# Patient Record
Sex: Female | Born: 1980 | Race: Black or African American | Hispanic: No | Marital: Married | Smoking: Never smoker
Health system: Southern US, Community
[De-identification: ages and names within clinical notes are randomized; demographics above are authoritative.]

## PROBLEM LIST (undated history)

## (undated) DIAGNOSIS — E559 Vitamin D deficiency, unspecified: Secondary | ICD-10-CM

## (undated) DIAGNOSIS — E049 Nontoxic goiter, unspecified: Secondary | ICD-10-CM

## (undated) DIAGNOSIS — Z8041 Family history of malignant neoplasm of ovary: Secondary | ICD-10-CM

## (undated) DIAGNOSIS — Z803 Family history of malignant neoplasm of breast: Secondary | ICD-10-CM

## (undated) DIAGNOSIS — D649 Anemia, unspecified: Secondary | ICD-10-CM

## (undated) DIAGNOSIS — Z1371 Encounter for nonprocreative screening for genetic disease carrier status: Secondary | ICD-10-CM

## (undated) HISTORY — DX: Vitamin D deficiency, unspecified: E55.9

## (undated) HISTORY — DX: Nontoxic goiter, unspecified: E04.9

## (undated) HISTORY — DX: Encounter for nonprocreative screening for genetic disease carrier status: Z13.71

## (undated) HISTORY — DX: Family history of malignant neoplasm of ovary: Z80.41

## (undated) HISTORY — DX: Anemia, unspecified: D64.9

## (undated) HISTORY — PX: NO PAST SURGERIES: SHX2092

## (undated) HISTORY — DX: Family history of malignant neoplasm of breast: Z80.3

---

## 2012-01-06 ENCOUNTER — Inpatient Hospital Stay: Payer: Self-pay | Admitting: Obstetrics and Gynecology

## 2012-01-06 LAB — CBC WITH DIFFERENTIAL/PLATELET
Basophil #: 0 10*3/uL (ref 0.0–0.1)
Basophil %: 0.3 %
Eosinophil #: 0 10*3/uL (ref 0.0–0.7)
Eosinophil %: 0.2 %
HCT: 34.9 % — ABNORMAL LOW (ref 35.0–47.0)
HGB: 12.1 g/dL (ref 12.0–16.0)
Lymphocyte #: 1.6 10*3/uL (ref 1.0–3.6)
Lymphocyte %: 11.6 %
MCH: 27.5 pg (ref 26.0–34.0)
MCHC: 34.6 g/dL (ref 32.0–36.0)
MCV: 80 fL (ref 80–100)
Monocyte #: 0.7 x10 3/mm (ref 0.2–0.9)
Monocyte %: 5.3 %
Neutrophil #: 11.6 10*3/uL — ABNORMAL HIGH (ref 1.4–6.5)
Neutrophil %: 82.6 %
Platelet: 258 10*3/uL (ref 150–440)
RBC: 4.39 10*6/uL (ref 3.80–5.20)
RDW: 15.3 % — ABNORMAL HIGH (ref 11.5–14.5)
WBC: 14 10*3/uL — ABNORMAL HIGH (ref 3.6–11.0)

## 2012-01-07 LAB — HEMATOCRIT: HCT: 28.5 % — ABNORMAL LOW (ref 35.0–47.0)

## 2012-09-09 ENCOUNTER — Ambulatory Visit: Payer: Self-pay

## 2013-12-01 LAB — TSH: TSH: 0.53 u[IU]/mL (ref 0.41–5.90)

## 2013-12-05 ENCOUNTER — Ambulatory Visit: Payer: Self-pay | Admitting: Internal Medicine

## 2014-01-04 ENCOUNTER — Ambulatory Visit: Payer: Self-pay | Admitting: Otolaryngology

## 2015-02-03 ENCOUNTER — Encounter: Payer: Self-pay | Admitting: *Deleted

## 2015-02-03 ENCOUNTER — Ambulatory Visit
Admission: EM | Admit: 2015-02-03 | Discharge: 2015-02-03 | Disposition: A | Discharge status: Home / Self Care | Payer: No Typology Code available for payment source | Attending: Family Medicine | Admitting: Family Medicine

## 2015-02-03 DIAGNOSIS — J0100 Acute maxillary sinusitis, unspecified: Secondary | ICD-10-CM

## 2015-02-03 DIAGNOSIS — J0101 Acute recurrent maxillary sinusitis: Secondary | ICD-10-CM | POA: Diagnosis not present

## 2015-02-03 MED ORDER — MOMETASONE FUROATE 50 MCG/ACT NA SUSP: 2.0000 | Freq: Every day | NASAL | Status: DC

## 2015-02-03 MED ORDER — AMOXICILLIN-POT CLAVULANATE 875-125 MG PO TABS: 1.0000 | ORAL_TABLET | Freq: Two times a day (BID) | ORAL | Status: DC

## 2015-02-03 MED ORDER — FEXOFENADINE-PSEUDOEPHED ER 180-240 MG PO TB24: 1.0000 | ORAL_TABLET | Freq: Every day | ORAL | Status: DC

## 2015-02-03 NOTE — ED Provider Notes (Signed)
CSN: [id number]     Arrival date & time 02/03/15  0805 History   First MD Initiated Contact with Patient 02/03/15 [id number]    Nurses notes were reviewed.  Chief Complaint  Patient presents with  . Facial Pain    Patient reports having facial pain nasal congestion and coughing. She states is been going on for over a week but progressively getting worse. She denies any concerns symptoms mostly pressure behind her eyes and nose and the right is bothering her as well.  (Consider location/radiation/quality/duration/timing/severity/associated sxs/prior Treatment) Patient is a 34 y.o. female presenting with URI. The history is provided by the patient. No language interpreter was used.  URI Presenting symptoms: congestion, cough, ear pain, rhinorrhea and sore throat   Ear pain:    Location:  Right   Severity:  Moderate   Duration:  4 days   Progression:  Worsening   Chronicity:  New Severity:  Moderate Onset quality:  Sudden Timing:  Constant Chronicity:  New Relieved by:  Nothing Ineffective treatments: Afrin nasal spray. Associated symptoms: headaches, sinus pain and swollen glands   Associated symptoms: no myalgias, no sneezing and no wheezing   Risk factors: not elderly, no chronic cardiac disease, no chronic kidney disease, no chronic respiratory disease, no diabetes mellitus, no immunosuppression, no recent illness, no recent travel and no sick contacts     History reviewed. No pertinent past medical history. No past surgical history on file. History reviewed. No pertinent family history. Social History  Substance Use Topics  . Smoking status: None  . Smokeless tobacco: None  . Alcohol Use: None   OB History    No data available     Review of Systems  HENT: Positive for congestion, ear pain, rhinorrhea and sore throat. Negative for sneezing.   Respiratory: Positive for cough. Negative for wheezing.   Musculoskeletal: Negative for myalgias.  Neurological: Positive for  headaches.  All other systems reviewed and are negative.   Allergies  Review of patient's allergies indicates no known allergies.  Home Medications   Prior to Admission medications   Medication Sig Start Date End Date Taking? Authorizing Provider  aspirin 325 MG tablet Take 325 mg by mouth daily.   Yes Historical Provider, MD  phenylephrine (SUDAFED PE) 10 MG TABS tablet Take 10 mg by mouth every 4 (four) hours as needed.   Yes Historical Provider, MD  amoxicillin-clavulanate (AUGMENTIN) 875-125 MG tablet Take 1 tablet by mouth 2 (two) times daily. 02/03/15   Hassan Rowan, MD  fexofenadine-pseudoephedrine (ALLEGRA-D ALLERGY & CONGESTION) 180-240 MG 24 hr tablet Take 1 tablet by mouth daily. 02/03/15   Hassan Rowan, MD  mometasone (NASONEX) 50 MCG/ACT nasal spray Place 2 sprays into the nose daily. 02/03/15   Hassan Rowan, MD   Meds Ordered and Administered this Visit  Medications - No data to display  BP 121/81 mmHg  Pulse 96  Temp(Src) 98.1 F (36.7 C) (Oral)  Ht  (1.676 m)  Wt 235 lb (106.595 kg)  BMI 37.95 kg/m2  SpO2 98%  LMP 01/21/2015 No data found.   Physical Exam  Constitutional: She appears well-developed.  HENT:  Head: Normocephalic and atraumatic.  Right Ear: Hearing and ear canal normal. Tympanic membrane is erythematous and bulging.  Left Ear: Hearing, tympanic membrane, external ear and ear canal normal.  Nose: Mucosal edema and rhinorrhea present. Right sinus exhibits maxillary sinus tenderness.  Mouth/Throat: Mucous membranes are normal. No oral lesions. No dental caries. Posterior oropharyngeal erythema present.  Vitals  reviewed.   ED Course  Procedures (including critical care time)  Labs Review Labs Reviewed - No data to display  Imaging Review No results found.   Visual Acuity Review  Right Eye Distance:   Left Eye Distance:   Bilateral Distance:    Right Eye Near:   Left Eye Near:    Bilateral Near:         MDM   1. Acute  recurrent maxillary sinusitis    We'll place on Augmentin 875 one tablet twice a day Flonase nasal spray recommend she stop using the Afrin nasal spray that she's currently using now. Follow-up PCP in a week if not better work note given for today as well. Also placed on Allegra-D 1 tablet daily.    Hassan RowanEugene Lavelle Akel, MD 02/03/15 1044

## 2015-02-03 NOTE — ED Notes (Signed)
Sinus congestion and headache for over a week.  Increase in sinus congestion over the last three days with no relief from otc Sudafed and [employer] Pot.  Mucus is thick beige.

## 2015-02-03 NOTE — Discharge Instructions (Signed)
Sinusitis, Adult °Sinusitis is redness, soreness, and puffiness (inflammation) of the air pockets in the bones of your face (sinuses). The redness, soreness, and puffiness can cause air and mucus to get trapped in your sinuses. This can allow germs to grow and cause an infection.  °HOME CARE  °· Drink enough fluids to keep your pee (urine) clear or pale yellow. °· Use a humidifier in your home. °· Run a hot shower to create steam in the bathroom. Sit in the bathroom with the door closed. Breathe in the steam 3-4 times a day. °· Put a warm, moist washcloth on your face 3-4 times a day, or as told by your doctor. °· Use salt water sprays (saline sprays) to wet the thick fluid in your nose. This can help the sinuses drain. °· Only take medicine as told by your doctor. °GET HELP RIGHT AWAY IF:  °· Your pain gets worse. °· You have very bad headaches. °· You are sick to your stomach (nauseous). °· You throw up (vomit). °· You are very sleepy (drowsy) all the time. °· Your face is puffy (swollen). °· Your vision changes. °· You have a stiff neck. °· You have trouble breathing. °MAKE SURE YOU:  °· Understand these instructions. °· Will watch your condition. °· Will get help right away if you are not doing well or get worse. °  °This information is not intended to replace advice given to you by your health care provider. Make sure you discuss any questions you have with your health care provider. °  °Document Released: 08/26/2007 Document Revised: 03/30/2014 Document Reviewed: 10/13/2011 °Elsevier Interactive Patient Education ©2016 Elsevier Inc[employer] °Barotitis Media °Barotitis media is inflammation of your middle ear. This occurs when the auditory tube (eustachian tube) leading from the back of your nose (nasopharynx) to your eardrum is blocked. This blockage may result from a cold, environmental allergies, or an upper respiratory infection. Unresolved barotitis media may lead to damage or hearing loss (barotrauma), which may  become permanent. °HOME CARE INSTRUCTIONS  °· Use medicines as recommended by your health care provider. Over-the-counter medicines will help unblock the canal and can help during times of air travel. °· Do not put anything into your ears to clean or unplug them. Eardrops will not be helpful. °· Do not swim, dive, or fly until your health care provider says it is all right to do so. If these activities are necessary, chewing gum with frequent, forceful swallowing may help. It is also helpful to hold your nose and gently blow to pop your ears for equalizing pressure changes. This forces air into the eustachian tube. °· Only take over-the-counter or prescription medicines for pain, discomfort, or fever as directed by your health care provider. °· A decongestant may be helpful in decongesting the middle ear and make pressure equalization easier. °SEEK MEDICAL CARE IF: °· You experience a serious form of dizziness in which you feel as if the room is spinning and you feel nauseated (vertigo). °· Your symptoms only involve one ear. °SEEK IMMEDIATE MEDICAL CARE IF:  °· You develop a severe headache, dizziness, or severe ear pain. °· You have bloody or pus-like drainage from your ears. °· You develop a fever. °· Your problems do not improve or become worse. °MAKE SURE YOU:  °· Understand these instructions. °· Will watch your condition. °· Will get help right away if you are not doing well or get worse. °  °This information is not intended to replace advice given to   you by your health care provider. Make sure you discuss any questions you have with your health care provider. °  °Document Released: 03/06/2000 Document Revised: 12/28/2012 Document Reviewed: 10/04/2012 °Elsevier Interactive Patient Education ©2016 Elsevier Inc. ° °

## 2015-02-27 ENCOUNTER — Encounter: Payer: Self-pay | Admitting: Internal Medicine

## 2015-02-27 DIAGNOSIS — E010 Iodine-deficiency related diffuse (endemic) goiter: Secondary | ICD-10-CM

## 2015-02-27 DIAGNOSIS — J45909 Unspecified asthma, uncomplicated: Secondary | ICD-10-CM | POA: Insufficient documentation

## 2015-02-27 DIAGNOSIS — E049 Nontoxic goiter, unspecified: Secondary | ICD-10-CM | POA: Insufficient documentation

## 2015-11-21 ENCOUNTER — Other Ambulatory Visit: Payer: Self-pay | Admitting: Internal Medicine

## 2015-11-27 ENCOUNTER — Ambulatory Visit: Payer: Self-pay | Admitting: Internal Medicine

## 2016-01-17 ENCOUNTER — Other Ambulatory Visit: Payer: Self-pay | Admitting: Otolaryngology

## 2016-01-17 DIAGNOSIS — E041 Nontoxic single thyroid nodule: Secondary | ICD-10-CM

## 2016-01-29 ENCOUNTER — Ambulatory Visit
Admission: RE | Admit: 2016-01-29 | Discharge: 2016-01-29 | Disposition: A | Discharge status: Home / Self Care | Payer: No Typology Code available for payment source | Source: Ambulatory Visit | Attending: Otolaryngology | Admitting: Otolaryngology

## 2016-01-29 DIAGNOSIS — E041 Nontoxic single thyroid nodule: Secondary | ICD-10-CM | POA: Insufficient documentation

## 2016-08-11 ENCOUNTER — Encounter: Payer: Self-pay | Admitting: Obstetrics and Gynecology

## 2016-08-11 ENCOUNTER — Ambulatory Visit (INDEPENDENT_AMBULATORY_CARE_PROVIDER_SITE_OTHER): Payer: No Typology Code available for payment source | Admitting: Obstetrics and Gynecology

## 2016-08-11 VITALS — BP 124/72 | Ht 66.0 in | Wt 223.0 lb

## 2016-08-11 DIAGNOSIS — N760 Acute vaginitis: Secondary | ICD-10-CM | POA: Diagnosis not present

## 2016-08-11 DIAGNOSIS — Z30430 Encounter for insertion of intrauterine contraceptive device: Secondary | ICD-10-CM | POA: Diagnosis not present

## 2016-08-11 DIAGNOSIS — Z8041 Family history of malignant neoplasm of ovary: Secondary | ICD-10-CM | POA: Diagnosis not present

## 2016-08-11 DIAGNOSIS — Z01419 Encounter for gynecological examination (general) (routine) without abnormal findings: Secondary | ICD-10-CM

## 2016-08-11 MED ORDER — CLOTRIMAZOLE-BETAMETHASONE 1-0.05 % EX CREA: 1.0000 "application " | TOPICAL_CREAM | Freq: Two times a day (BID) | CUTANEOUS | 0 refills | Status: DC

## 2016-08-11 MED ORDER — LEVONORGESTREL 20 MCG/24HR IU IUD: 1.0000 | INTRAUTERINE_SYSTEM | Freq: Once | INTRAUTERINE | 0 refills | Status: AC

## 2016-08-11 NOTE — Progress Notes (Addendum)
Chief Complaint  Patient presents with  . Annual Exam     HPI:      [name] is a 36 y.o. [id number] who LMP was Patient's last menstrual period was 08/07/2016., presents today for her annual examination.  Her menses are regular every 28-30 days, lasting 5 days.  Dysmenorrhea none. She does not have intermenstrual bleeding.  Sex activity: single partner, contraception - condoms sometimes. She had decreased libido and spotting with OCPs/nuvaring. Did depo in the past. Hesitant about IUD but wants to try it.  Last Pap: May 16, 2015  Results were: no abnormalities /neg HPV DNA  Hx of STDs: none  There is a FH of breast cancer in her MGM and PGM. There is a FH of ovarian cancer in her pat aunt. The patient does not do self-breast exams.  Tobacco use: The patient denies current or previous tobacco use. Alcohol use: social drinker Exercise: not active  She does not get adequate calcium and Vitamin D in her diet.  She is being followed for enlarged thyroid.  Past Medical History:  Diagnosis Date  . Anemia     History reviewed. No pertinent surgical history.  Family History  Problem Relation Age of Onset  . Breast cancer Maternal Grandmother        ? age  . Diabetes Maternal Grandmother   . Breast cancer Paternal Grandmother 59  . Diabetes Paternal Grandmother   . Colon cancer Paternal Grandfather   . Ovarian cancer Paternal Aunt 36    Social History   Social History  . Marital status: Married    Spouse name: N/A  . Number of children: N/A  . Years of education: N/A   Occupational History  . Not on file.   Social History Main Topics  . Smoking status: Never Smoker  . Smokeless tobacco: Never Used  . Alcohol use Yes  . Drug use: No  . Sexual activity: Yes    Birth control/ protection: None   Other Topics Concern  . Not on file   Social History Narrative  . No narrative on file     Current Outpatient Prescriptions:  .  mometasone  (NASONEX) 50 MCG/ACT nasal spray, Place 2 sprays into the nose daily., Disp: 17 g, Rfl: -0 .  phenylephrine (SUDAFED PE) 10 MG TABS tablet, Take 10 mg by mouth every 4 (four) hours as needed., Disp: , Rfl:  .  amoxicillin-clavulanate (AUGMENTIN) 875-125 MG tablet, Take 1 tablet by mouth 2 (two) times daily. (Patient not taking: Reported on 08/11/2016), Disp: 20 tablet, Rfl: 0 .  aspirin 325 MG tablet, Take 325 mg by mouth daily., Disp: , Rfl:  .  clotrimazole-betamethasone (LOTRISONE) cream, Apply 1 application topically 2 (two) times daily. Apply externally BID prn sx up to 2 wks, Disp: 15 g, Rfl: 0 .  fexofenadine-pseudoephedrine (ALLEGRA-D ALLERGY & CONGESTION) 180-240 MG 24 hr tablet, Take 1 tablet by mouth daily. (Patient not taking: Reported on 08/11/2016), Disp: 30 tablet, Rfl: 0 .  levonorgestrel (MIRENA, 52 MG,) 20 MCG/24HR IUD, 1 Intra Uterine Device (1 each total) by Intrauterine route once., Disp: 1 Intra Uterine Device, Rfl: 0  ROS:  Review of Systems  Constitutional: Negative for fatigue, fever and unexpected weight change.  Respiratory: Negative for cough, shortness of breath and wheezing.   Cardiovascular: Negative for chest pain, palpitations and leg swelling.  Gastrointestinal: Negative for blood in stool, constipation, diarrhea, nausea and vomiting.  Endocrine: Negative for cold intolerance, heat intolerance and  polyuria.  Genitourinary: Negative for dyspareunia, dysuria, flank pain, frequency, genital sores, hematuria, menstrual problem, pelvic pain, urgency, vaginal bleeding, vaginal discharge and vaginal pain.  Musculoskeletal: Negative for back pain, joint swelling and myalgias.  Skin: Negative for rash.  Neurological: Negative for dizziness, syncope, light-headedness, numbness and headaches.  Hematological: Negative for adenopathy.  Psychiatric/Behavioral: Negative for agitation, confusion, sleep disturbance and suicidal ideas. The patient is not nervous/anxious.       Objective: BP 124/72   Ht 5\' 6"  (1.676 m)   Wt 223 lb (101.2 kg)   LMP 08/07/2016   BMI 35.99 kg/m    Physical Exam  Constitutional: She is oriented to person, place, and time. She appears well-developed and well-nourished.  Genitourinary: Vagina normal and uterus normal. There is no rash or tenderness on the right labia. There is no rash or tenderness on the left labia. No erythema or tenderness in the vagina. No vaginal discharge found. Right adnexum does not display mass and does not display tenderness. Left adnexum does not display mass and does not display tenderness. Cervix does not exhibit motion tenderness or polyp. Uterus is not enlarged or tender.  Neck: Normal range of motion. No thyromegaly present.  Cardiovascular: Normal rate, regular rhythm and normal heart sounds.   No murmur heard. Pulmonary/Chest: Effort normal and breath sounds normal. Right breast exhibits no mass, no nipple discharge, no skin change and no tenderness. Left breast exhibits no mass, no nipple discharge, no skin change and no tenderness.  Abdominal: Soft. There is no tenderness. There is no guarding.  Musculoskeletal: Normal range of motion.  Neurological: She is alert and oriented to person, place, and time. No cranial nerve deficit.  Psychiatric: She has a normal mood and affect. Her behavior is normal.  Vitals reviewed.   Assessment/Plan: Encounter for annual routine gynecological examination  Encounter for insertion of intrauterine contraceptive device (IUD) - Plan: levonorgestrel (MIRENA, 52 MG,) 20 MCG/24HR IUD  Acute vaginitis - Pt with ext irritation. Neg exam. Try Rx clotrimazole/betamethasone crm. F/u prn. - Plan: clotrimazole-betamethasone (LOTRISONE) cream  Family history of ovarian cancer - My Risk testing discussed and handout given. Pt to consider and f/u prn.            GYN counsel family planning choices, adequate intake of calcium and vitamin D, diet and exercise      F/U  Return in about 4 weeks (around 09/08/2016) for IUD check.  Alicia B. Copland, PA-C 08/11/2016 2:38 PM      IUD PROCEDURE NOTE:  Christine Meadows is a 36 y.o. [id number] here for Mirena  IUD insertion. No GYN concerns.   BP 124/72   Ht 5\' 6"  (1.676 m)   Wt 223 lb (101.2 kg)   LMP 08/07/2016   BMI 35.99 kg/m   IUD Insertion Procedure Note Patient identified, informed consent performed, consent signed.   Discussed risks of irregular bleeding, cramping, infection, malpositioning or misplacement of the IUD outside the uterus which may require further procedure such as laparoscopy, risk of failure <1%. Time out was performed.    A bimanual exam showed the uterus to be anteverted.  Speculum placed in the vagina.  Cervix visualized.  Cleaned with Betadine x 2.  Grasped anteriorly with a single tooth tenaculum.  Uterus sounded to 8.0 cm.   IUD placed per manufacturer's recommendations.  Strings trimmed to 3 cm. Tenaculum was removed, good hemostasis noted.  Patient tolerated procedure well.   ASSESSMENT: IUD insertion Lot [id number] Exp 01/2019  Plan:  Patient was given post-procedure instructions.  She was advised to have backup contraception for one week.   Call if you are having increasing pain, cramps or bleeding or if you have a fever greater than 100.4 degrees F., shaking chills, nausea or vomiting. Patient was also asked to check IUD strings periodically and follow up in 4 weeks for IUD check.  Return in about 4 weeks (around 09/08/2016) for IUD check.  Alicia B. Copland, PA-C 08/11/2016 2:38 PM

## 2016-09-08 ENCOUNTER — Ambulatory Visit (INDEPENDENT_AMBULATORY_CARE_PROVIDER_SITE_OTHER): Payer: No Typology Code available for payment source | Admitting: Obstetrics and Gynecology

## 2016-09-08 ENCOUNTER — Encounter: Payer: Self-pay | Admitting: Obstetrics and Gynecology

## 2016-09-08 VITALS — BP 112/62 | HR 74 | Ht 65.0 in | Wt 218.0 lb

## 2016-09-08 DIAGNOSIS — Z30431 Encounter for routine checking of intrauterine contraceptive device: Secondary | ICD-10-CM | POA: Diagnosis not present

## 2016-09-08 NOTE — Progress Notes (Signed)
   Chief Complaint  Patient presents with  . Contraception    IUD string check     History of Present Illness:  Lyanne CoJamila Satterfield Sepulveda is a 36 y.o. that had a Mirena IUD placed approximately 1 month ago. Since that time, she states she has had daily dark brown spotting, minimal cramping. No pelvic pain, heavy bleeding, unusual d/c.   Review of Systems  Constitutional: Negative for fever.  Gastrointestinal: Negative for blood in stool, constipation, diarrhea, nausea and vomiting.  Genitourinary: Positive for vaginal bleeding. Negative for dyspareunia, dysuria, flank pain, frequency, hematuria, urgency, vaginal discharge and vaginal pain.  Musculoskeletal: Negative for back pain.  Skin: Negative for rash.    Physical Exam:  BP 112/62   Pulse 74   Ht 5\' 5"  (1.651 m)   Wt 218 lb (98.9 kg)   LMP 08/07/2016 (Exact Date)   BMI 36.28 kg/m  Body mass index is 36.28 kg/m.  Pelvic exam:  Two IUD strings present seen coming from the cervical os. EGBUS, vaginal vault and cervix: within normal limits   Assessment:  Routine checking of IUD Encounter for routine checking of intrauterine contraceptive device (IUD) - IUD in place. Reassurance re: spotting/minimal cramping. F/u prn.  IUD strings present in proper location; pt doing well  Plan: F/u if any signs of infection or can no longer feel the strings.   Alicia B. Copland, PA-C 09/08/2016 4:11 PM

## 2016-10-14 ENCOUNTER — Encounter: Payer: Self-pay | Admitting: Obstetrics and Gynecology

## 2016-10-14 ENCOUNTER — Ambulatory Visit (INDEPENDENT_AMBULATORY_CARE_PROVIDER_SITE_OTHER): Payer: No Typology Code available for payment source | Admitting: Obstetrics and Gynecology

## 2016-10-14 VITALS — BP 118/82 | HR 68 | Ht 66.0 in | Wt 223.0 lb

## 2016-10-14 DIAGNOSIS — Z30431 Encounter for routine checking of intrauterine contraceptive device: Secondary | ICD-10-CM | POA: Diagnosis not present

## 2016-10-14 DIAGNOSIS — N898 Other specified noninflammatory disorders of vagina: Secondary | ICD-10-CM

## 2016-10-14 NOTE — Progress Notes (Signed)
   Chief Complaint  Patient presents with  . Contraception    IUD string check     History of Present Illness:  Christine Meadows is a 36 y.o. that had a Mirena IUD placed approximately 2 months ago. Since that time, she has done well. Her non-menstrual spotting after IUD check last month has improved. She is having a little brown d/c and cramping now but it's time for her period. She tried to find her strings the other day but couldn't feel them.  She also noticed increased d/c with a strong fishy odor last wk. She has a hx of BV in the past after sex with her husband. Sx happened again after sex this time. She treated with an OTC pH medication with sx relief. She denies any urin sx, LBP, fevers. No new sex partners.   Review of Systems  Constitutional: Negative for fever.  Gastrointestinal: Negative for blood in stool, constipation, diarrhea, nausea and vomiting.  Genitourinary: Positive for vaginal bleeding and vaginal discharge. Negative for dyspareunia, dysuria, flank pain, frequency, hematuria, urgency and vaginal pain.  Musculoskeletal: Negative for back pain.  Skin: Negative for rash.    Physical Exam:  BP 118/82   Pulse 68   Ht 5\' 6"  (1.676 m)   Wt 223 lb (101.2 kg)   BMI 35.99 kg/m  Body mass index is 35.99 kg/m.  Pelvic exam:  Two IUD strings present seen coming from the cervical os. Brown d/c present. EGBUS, vaginal vault and cervix: within normal limits   Assessment:  Encounter for routine checking of intrauterine contraceptive device (IUD) - IUD in place. Reassurance.  Vaginal discharge - Neg wet prep after OTC tx for BV. Sx look related to light spotting with menses. Will send in BV Rx if sx recur.   IUD strings present in proper location; pt doing well  Plan: F/u if any signs of infection or can no longer feel the strings.   Kadyn Chovan B. Winfield Caba, PA-C 10/14/2016 3:13 PM

## 2016-10-24 ENCOUNTER — Encounter: Payer: Self-pay | Admitting: Obstetrics and Gynecology

## 2016-10-26 ENCOUNTER — Other Ambulatory Visit: Payer: Self-pay | Admitting: Obstetrics and Gynecology

## 2016-10-26 MED ORDER — METRONIDAZOLE 500 MG PO TABS: 500.0000 mg | ORAL_TABLET | Freq: Two times a day (BID) | ORAL | 0 refills | Status: AC

## 2016-10-26 NOTE — Progress Notes (Signed)
BV sx persist

## 2017-03-23 DIAGNOSIS — E559 Vitamin D deficiency, unspecified: Secondary | ICD-10-CM

## 2017-03-23 HISTORY — DX: Vitamin D deficiency, unspecified: E55.9

## 2017-06-01 ENCOUNTER — Ambulatory Visit
Admission: EM | Admit: 2017-06-01 | Discharge: 2017-06-01 | Disposition: A | Discharge status: Home / Self Care | Payer: No Typology Code available for payment source | Attending: Family Medicine | Admitting: Family Medicine

## 2017-06-01 ENCOUNTER — Other Ambulatory Visit: Payer: Self-pay

## 2017-06-01 DIAGNOSIS — G44209 Tension-type headache, unspecified, not intractable: Secondary | ICD-10-CM

## 2017-06-01 MED ORDER — BUTALBITAL-APAP-CAFFEINE 50-325-40 MG PO TABS: 1.0000 | ORAL_TABLET | Freq: Four times a day (QID) | ORAL | 0 refills | Status: DC | PRN

## 2017-06-01 NOTE — ED Provider Notes (Signed)
[facility]  CSN: [id number] Arrival date & time: 06/01/17  1648  History   Chief Complaint Chief Complaint  Patient presents with  . Headache   HPI  37 year old female presents with headache.  Started Saturday.  She describes it as pressure.  Pain is located diffusely per her report.  Primarily affects the back of the head and frontal regions.  She has had no improvement with Tylenol or sinus medication.  She denies any associated symptoms of sinusitis.  No recent fevers or chills.  No known exacerbating factors.  No other associated symptoms.  No other complaints at this time.  Past Medical History:  Diagnosis Date  . Anemia    Patient Active Problem List   Diagnosis Date Noted  . 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 Big thyroid 02/27/2015   Past Surgical History:  Procedure Laterality Date  . NO PAST SURGERIES     OB History    Gravida Para Term Preterm AB Living   3 2 2   1 2    SAB TAB Ectopic Multiple Live Births     1     2     Home Medications    Prior to Admission medications   Medication Sig Start Date End Date Taking? Authorizing Provider  albuterol (PROAIR HFA) 108 (90 Base) MCG/ACT inhaler INHALE 2 INHALATIONS INTO THE LUNGS EVERY 6 (SIX) HOURS AS NEEDED FOR WHEEZING. 10/05/14  Yes [provider]  levonorgestrel (MIRENA, 52 MG,) 20 MCG/24HR IUD 1 Intra Uterine Device (1 each total) by Intrauterine route once. 08/11/16 06/01/17 Yes Copland, Ilona Sorrel, PA-C  butalbital-acetaminophen-caffeine (FIORICET, ESGIC) [phone] MG tablet Take 1 tablet by mouth every 6 (six) hours as needed for headache. 06/01/17   Tommie Sams, DO  clotrimazole-betamethasone (LOTRISONE) cream Apply 1 application topically 2 (two) times daily. Apply externally BID prn sx up to 2 wks 08/11/16   Copland, Ilona Sorrel, PA-C    Family History Family History  Problem Relation Age of Onset  . Breast cancer Maternal Grandmother        ? age  . Diabetes Maternal Grandmother     . Breast cancer Paternal Grandmother 82  . Diabetes Paternal Grandmother   . Colon cancer Paternal Grandfather   . Ovarian cancer Paternal Aunt 25    Social History Social History   Tobacco Use  . Smoking status: Never Smoker  . Smokeless tobacco: Never Used  Substance Use Topics  . Alcohol use: Yes    Alcohol/week: 0.6 - 1.2 oz    Types: 1 - 2 Glasses of wine per week    Comment: occasional  . Drug use: No     Allergies   Patient has no known allergies.   Review of Systems Review of Systems  Constitutional: Negative.   HENT: Negative.   Neurological: Positive for headaches.   Physical Exam Triage Vital Signs ED Triage Vitals  Enc Vitals Group     BP 06/01/17 1702 120/81     Pulse Rate 06/01/17 1702 76     Resp 06/01/17 1702 16     Temp 06/01/17 1702 99.1 F (37.3 C)     Temp Source 06/01/17 1702 Oral     SpO2 06/01/17 1702 99 %     Weight 06/01/17 1703 227 lb (103 kg)     Height 06/01/17 1703 5\' 5"  (1.651 m)     Head Circumference --      Peak Flow --      Pain  Score 06/01/17 1702 6     Pain Loc --      Pain Edu? --      Excl. in GC? --    Updated Vital Signs BP 120/81 (BP Location: Left Arm)   Pulse 76   Temp 99.1 F (37.3 C) (Oral)   Resp 16   Ht 5\' 5"  (1.651 m)   Wt 227 lb (103 kg)   SpO2 99%   BMI 37.77 kg/m   Physical Exam  Constitutional: She is oriented to person, place, and time. She appears well-developed. No distress.  HENT:  Head: Normocephalic and atraumatic.  Mouth/Throat: Oropharynx is clear and moist.  Eyes: Conjunctivae are normal. Right eye exhibits no discharge. Left eye exhibits no discharge.  Cardiovascular: Normal rate and regular rhythm.  Pulmonary/Chest: Effort normal and breath sounds normal.  Neurological: She is alert and oriented to person, place, and time.  Psychiatric: She has a normal mood and affect. Her behavior is normal.  Nursing note and vitals reviewed.  UC Treatments / Results  Labs (all labs ordered  are listed, but only abnormal results are displayed) Labs Reviewed - No data to display  EKG  EKG Interpretation None       Radiology No results found.  Procedures Procedures (including critical care time)  Medications Ordered in UC Medications - No data to display   Initial Impression / Assessment and Plan / UC Course  I have reviewed the triage vital signs and the nursing notes.  Pertinent labs & imaging results that were available during my care of the patient were reviewed by me and considered in my medical decision making (see chart for details).     37 year old female presents with clinical picture consistent with tension headache.  Treating with Fioricet.  Final Clinical Impressions(s) / UC Diagnoses   Final diagnoses:  Tension headache    ED Discharge Orders        Ordered    butalbital-acetaminophen-caffeine (FIORICET, ESGIC) 50-325-40 MG tablet  Every 6 hours PRN     06/01/17 1720     Controlled Substance Prescriptions [place] Controlled Substance Registry consulted? No   Tommie SamsCook, Abbigayle Toole G, [place]DO 06/01/17 1730

## 2017-06-01 NOTE — ED Triage Notes (Signed)
Pt states she feels fatigued, and is also c/o sinus pressure. Has tried Tylenol, without relief.

## 2017-10-21 DIAGNOSIS — Z803 Family history of malignant neoplasm of breast: Secondary | ICD-10-CM

## 2017-10-21 DIAGNOSIS — Z1371 Encounter for nonprocreative screening for genetic disease carrier status: Secondary | ICD-10-CM

## 2017-10-21 HISTORY — DX: Family history of malignant neoplasm of breast: Z80.3

## 2017-10-21 HISTORY — DX: Encounter for nonprocreative screening for genetic disease carrier status: Z13.71

## 2017-11-08 ENCOUNTER — Ambulatory Visit (INDEPENDENT_AMBULATORY_CARE_PROVIDER_SITE_OTHER): Payer: No Typology Code available for payment source | Admitting: Obstetrics and Gynecology

## 2017-11-08 ENCOUNTER — Encounter: Payer: Self-pay | Admitting: Family Medicine

## 2017-11-08 ENCOUNTER — Encounter: Payer: Self-pay | Admitting: Obstetrics and Gynecology

## 2017-11-08 VITALS — BP 120/80 | HR 86 | Ht 66.0 in | Wt 234.0 lb

## 2017-11-08 DIAGNOSIS — Z8041 Family history of malignant neoplasm of ovary: Secondary | ICD-10-CM

## 2017-11-08 DIAGNOSIS — Z01419 Encounter for gynecological examination (general) (routine) without abnormal findings: Secondary | ICD-10-CM

## 2017-11-08 DIAGNOSIS — Z1321 Encounter for screening for nutritional disorder: Secondary | ICD-10-CM

## 2017-11-08 DIAGNOSIS — Z131 Encounter for screening for diabetes mellitus: Secondary | ICD-10-CM | POA: Diagnosis not present

## 2017-11-08 DIAGNOSIS — Z01411 Encounter for gynecological examination (general) (routine) with abnormal findings: Secondary | ICD-10-CM

## 2017-11-08 DIAGNOSIS — Z30431 Encounter for routine checking of intrauterine contraceptive device: Secondary | ICD-10-CM

## 2017-11-08 DIAGNOSIS — N898 Other specified noninflammatory disorders of vagina: Secondary | ICD-10-CM

## 2017-11-08 DIAGNOSIS — Z111 Encounter for screening for respiratory tuberculosis: Secondary | ICD-10-CM

## 2017-11-08 DIAGNOSIS — E049 Nontoxic goiter, unspecified: Secondary | ICD-10-CM

## 2017-11-08 DIAGNOSIS — Z0000 Encounter for general adult medical examination without abnormal findings: Secondary | ICD-10-CM

## 2017-11-08 DIAGNOSIS — Z1329 Encounter for screening for other suspected endocrine disorder: Secondary | ICD-10-CM

## 2017-11-08 NOTE — Patient Instructions (Signed)
I value your feedback and entrusting us with your care. If you get a [facility] patient survey, I would appreciate you taking the time to let us know about your experience today. Thank you! 

## 2017-11-08 NOTE — Progress Notes (Signed)
Chief Complaint  Patient presents with  . Gynecologic Exam     HPI:      Christine Meadows is a 37 y.o. [id number] who LMP was No LMP recorded. (Menstrual status: IUD)., presents today for her annual examination.  Her menses are infrequent with IUD. Has occas vag bleeding and cramping.   Sex activity: single partner, contraception- MIrena placed 08/11/16. Has intermittent increased d/c without itch/odor. Treats with boric acid supp with sx relief. She had decreased libido and spotting with OCPs/nuvaring. Did depo in the past.   Last Pap: May 16, 2015  Results were: no abnormalities /neg HPV DNA  Hx of STDs: none  There is a FH of breast cancer in her MGM, pat aunt and PGM. There is a FH of ovarian cancer in her pat aunt. Pt declined genetic testing last yr but interested this yr. The patient does not do self-breast exams.  Tobacco use: The patient denies current or previous tobacco use. Alcohol use: social drinker Exercise: not active  She does not get adequate calcium and Vitamin D in her diet.  She is being followed for enlarged thyroid but hasn't had recent thyroid labs. Needs TB test for work physical.   Past Medical History:  Diagnosis Date  . Anemia   . Enlarged thyroid     Past Surgical History:  Procedure Laterality Date  . NO PAST SURGERIES      Family History  Problem Relation Age of Onset  . Breast cancer Maternal Grandmother        40s  . Diabetes Maternal Grandmother   . Breast cancer Paternal Grandmother 31  . Diabetes Paternal Grandmother   . Colon cancer Paternal Grandfather   . Ovarian cancer Paternal Aunt 47  . Breast cancer Paternal Aunt   . Other Father 38       tumor in pituitary gland and had it removed    Social History   Socioeconomic History  . Marital status: Married    Spouse name: Not on file  . Number of children: Not on file  . Years of education: Not on file  . Highest education level: Not on file    Occupational History  . Not on file  Social Needs  . Financial resource strain: Not on file  . Food insecurity:    Worry: Not on file    Inability: Not on file  . Transportation needs:    Medical: Not on file    Non-medical: Not on file  Tobacco Use  . Smoking status: Never Smoker  . Smokeless tobacco: Never Used  Substance and Sexual Activity  . Alcohol use: Yes    Alcohol/week: 1.0 - 2.0 standard drinks    Types: 1 - 2 Glasses of wine per week    Comment: occasional  . Drug use: No  . Sexual activity: Yes    Birth control/protection: IUD    Comment: Mirena  Lifestyle  . Physical activity:    Days per week: Not on file    Minutes per session: Not on file  . Stress: Not on file  Relationships  . Social connections:    Talks on phone: Not on file    Gets together: Not on file    Attends religious service: Not on file    Active member of club or organization: Not on file    Attends meetings of clubs or organizations: Not on file    Relationship status: Not on file  . Intimate partner  violence:    Fear of current or ex partner: Not on file    Emotionally abused: Not on file    Physically abused: Not on file    Forced sexual activity: Not on file  Other Topics Concern  . Not on file  Social History Narrative  . Not on file     Current Outpatient Medications:  .  albuterol (PROAIR HFA) 108 (90 Base) MCG/ACT inhaler, INHALE 2 INHALATIONS INTO THE LUNGS EVERY 6 (SIX) HOURS AS NEEDED FOR WHEEZING., Disp: , Rfl:  .  levonorgestrel (MIRENA, 52 MG,) 20 MCG/24HR IUD, 1 Intra Uterine Device (1 each total) by Intrauterine route once., Disp: 1 Intra Uterine Device, Rfl: 0  ROS:  Review of Systems  Constitutional: Negative for fatigue, fever and unexpected weight change.  Respiratory: Negative for cough, shortness of breath and wheezing.   Cardiovascular: Negative for chest pain, palpitations and leg swelling.  Gastrointestinal: Negative for blood in stool, constipation,  diarrhea, nausea and vomiting.  Endocrine: Negative for cold intolerance, heat intolerance and polyuria.  Genitourinary: Negative for dyspareunia, dysuria, flank pain, frequency, genital sores, hematuria, menstrual problem, pelvic pain, urgency, vaginal bleeding, vaginal discharge and vaginal pain.  Musculoskeletal: Negative for back pain, joint swelling and myalgias.  Skin: Negative for rash.  Neurological: Negative for dizziness, syncope, light-headedness, numbness and headaches.  Hematological: Negative for adenopathy.  Psychiatric/Behavioral: Negative for agitation, confusion, sleep disturbance and suicidal ideas. The patient is not nervous/anxious.     Objective: BP 120/80   Pulse 86   Ht 5' 6"  (1.676 m)   Wt 234 lb (106.1 kg)   BMI 37.77 kg/m    Physical Exam  Constitutional: She is oriented to person, place, and time. She appears well-developed and well-nourished.  Genitourinary: Vagina normal and uterus normal. There is no rash or tenderness on the right labia. There is no rash or tenderness on the left labia. No erythema or tenderness in the vagina. No vaginal discharge found. Right adnexum does not display mass and does not display tenderness. Left adnexum does not display mass and does not display tenderness.  Cervix exhibits visible IUD strings. Cervix does not exhibit motion tenderness or polyp. Uterus is not enlarged or tender.  Neck: Normal range of motion. Thyromegaly present.  Cardiovascular: Normal rate, regular rhythm and normal heart sounds.  No murmur heard. Pulmonary/Chest: Effort normal and breath sounds normal. Right breast exhibits no mass, no nipple discharge, no skin change and no tenderness. Left breast exhibits no mass, no nipple discharge, no skin change and no tenderness.  Abdominal: Soft. There is no tenderness. There is no guarding.  Musculoskeletal: Normal range of motion.  Neurological: She is alert and oriented to person, place, and time. No cranial  nerve deficit.  Psychiatric: She has a normal mood and affect. Her behavior is normal.  Vitals reviewed.   Assessment/Plan: Encounter for annual routine gynecological examination  Encounter for routine checking of intrauterine contraceptive device (IUD) - IUD in place.   Family history of ovarian cancer - MyRisk testing discussed and done today. RTO in 6 wks for results.  - Plan: Integrated BRACAnalysis ([place])  Vaginal discharge - Neg exam. Reassurance based on sx. F/u prn  Blood tests for routine general physical examination - Plan: TSH + free T4, VITAMIN D 25 Hydroxy (Vit-D Deficiency, Fractures), Hemoglobin A1c  Enlarged thyroid - Check labs. Will call with results.  - Plan: TSH + free T4  Thyroid disorder screening - Plan: TSH + free T4  Screening for diabetes mellitus - Plan: Hemoglobin A1c  Encounter for vitamin deficiency screening - Plan: VITAMIN D 25 Hydroxy (Vit-D Deficiency, Fractures)  Screening for tuberculosis - Needed for work form.  - Plan: QuantiFERON-TB Gold Plus           GYN counsel adequate intake of calcium and vitamin D, diet and exercise     F/U  Return in about 6 weeks (around 12/20/2017) for [facility] results.  Zuria Fosdick B. Josef Tourigny, PA-C 11/08/2017 5:24 PM

## 2017-11-10 ENCOUNTER — Telehealth: Payer: Self-pay | Admitting: Obstetrics and Gynecology

## 2017-11-10 ENCOUNTER — Encounter: Payer: Self-pay | Admitting: Obstetrics and Gynecology

## 2017-11-10 NOTE — Telephone Encounter (Signed)
Pt aware of lab results. [facility] All labs WNL except Vit D deficiency. Pt plans to restart Vit D3 5000 IU supp.  Copy mailed to pt for records since she needs TB test results for work form.

## 2017-11-10 NOTE — Telephone Encounter (Signed)
Patient is schedule 12/22/17 with [facility]

## 2017-11-10 NOTE — Telephone Encounter (Signed)
-----   Message from Rica RecordsAlicia B Copland, PA-C sent at 11/08/2017  5:23 PM EDT ----- Regarding: appt Pls call pt to sched 6 wk MyRisk results appt. Forgot to do it when she checked out. Sry.

## 2017-11-16 ENCOUNTER — Encounter: Payer: Self-pay | Admitting: Obstetrics and Gynecology

## 2017-12-22 ENCOUNTER — Ambulatory Visit (INDEPENDENT_AMBULATORY_CARE_PROVIDER_SITE_OTHER): Payer: No Typology Code available for payment source | Admitting: Obstetrics and Gynecology

## 2017-12-22 ENCOUNTER — Encounter: Payer: Self-pay | Admitting: Obstetrics and Gynecology

## 2017-12-22 VITALS — BP 108/70 | HR 76 | Ht 66.0 in | Wt 238.0 lb

## 2017-12-22 DIAGNOSIS — Z803 Family history of malignant neoplasm of breast: Secondary | ICD-10-CM

## 2017-12-22 DIAGNOSIS — Z7183 Encounter for nonprocreative genetic counseling: Secondary | ICD-10-CM

## 2017-12-22 DIAGNOSIS — Z8041 Family history of malignant neoplasm of ovary: Secondary | ICD-10-CM | POA: Diagnosis not present

## 2017-12-22 NOTE — Progress Notes (Signed)
   Chief Complaint  Patient presents with  . MyRisk Results    HPI:   Christine Meadows is a 37 y.o. G3P2012 who LMP was No LMP recorded. (Menstrual status: IUD)., presents today for  My Risk cancer genetic testing results.  Results are negative for a cancer genetic mutation except AXIN2 VUS.  IBIS=18.6 % Riskscore=N/A Last mammogram N/A Vitamin D status: deficient 8/19; restarted Vit D supp   Past Medical History:  Diagnosis Date  . Anemia   . BRCA negative 10/2017   MyRisk neg except AXIN2 VUS  . Enlarged thyroid   . Family history of breast cancer 10/2017   IBIS=18.6%  . Family history of ovarian cancer   . Vitamin D deficiency 2019    Family History  Problem Relation Age of Onset  . Breast cancer Maternal Grandmother        50s  . Diabetes Maternal Grandmother   . Breast cancer Paternal Grandmother 50  . Diabetes Paternal Grandmother   . Colon cancer Paternal Grandfather   . Ovarian cancer Paternal Aunt 40  . Breast cancer Paternal Aunt   . Other Father 48       tumor in pituitary gland and had it removed     Assessment/Plan: Encounter for nonprocreative genetic counseling - IBIS=18.6%. NO genetic mutation.  Family history of breast cancer  Family history of ovarian cancer  Start mammos age 40. Cont Vit D supp for Vit D deficiency.   Patient understands these results only apply to her and her children, and this is not indicative of genetic testing results of her other family members. It is recommended that her other family members have genetic testing done.  Pt also understands negative genetic testing doesn't mean she will never get any of these cancers.   Results handout given to pt.  15  Min spent in direct contact with pt re: counseling/coordination of care.    Alicia B. Copland, PA-C 12/22/2017 4:48 PM   

## 2017-12-22 NOTE — Patient Instructions (Signed)
I value your feedback and entrusting us with your care. If you get a [facility] patient survey, I would appreciate you taking the time to let us know about your experience today. Thank you! 

## 2018-01-31 ENCOUNTER — Ambulatory Visit: Payer: Self-pay | Admitting: Obstetrics and Gynecology

## 2018-01-31 ENCOUNTER — Encounter

## 2018-11-02 ENCOUNTER — Other Ambulatory Visit: Payer: Self-pay

## 2018-11-02 DIAGNOSIS — Z20822 Contact with and (suspected) exposure to covid-19: Secondary | ICD-10-CM

## 2018-11-03 LAB — NOVEL CORONAVIRUS, NAA: SARS-CoV-2, NAA: NOT DETECTED

## 2019-01-20 ENCOUNTER — Ambulatory Visit
Admission: EM | Admit: 2019-01-20 | Discharge: 2019-01-20 | Disposition: A | Discharge status: Home / Self Care | Payer: No Typology Code available for payment source | Attending: Family Medicine | Admitting: Family Medicine

## 2019-01-20 ENCOUNTER — Other Ambulatory Visit: Payer: Self-pay

## 2019-01-20 ENCOUNTER — Encounter: Payer: Self-pay | Admitting: Emergency Medicine

## 2019-01-20 DIAGNOSIS — R5383 Other fatigue: Secondary | ICD-10-CM | POA: Diagnosis not present

## 2019-01-20 DIAGNOSIS — G44209 Tension-type headache, unspecified, not intractable: Secondary | ICD-10-CM

## 2019-01-20 LAB — CBC WITH DIFFERENTIAL/PLATELET
Abs Immature Granulocytes: 0.02 10*3/uL (ref 0.00–0.07)
Basophils Absolute: 0 10*3/uL (ref 0.0–0.1)
Basophils Relative: 0 %
Eosinophils Absolute: 0 10*3/uL (ref 0.0–0.5)
Eosinophils Relative: 0 %
HCT: 40.7 % (ref 36.0–46.0)
Hemoglobin: 12.8 g/dL (ref 12.0–15.0)
Immature Granulocytes: 0 %
Lymphocytes Relative: 33 %
Lymphs Abs: 2.3 10*3/uL (ref 0.7–4.0)
MCH: 25.3 pg — ABNORMAL LOW (ref 26.0–34.0)
MCHC: 31.4 g/dL (ref 30.0–36.0)
MCV: 80.6 fL (ref 80.0–100.0)
Monocytes Absolute: 0.4 10*3/uL (ref 0.1–1.0)
Monocytes Relative: 6 %
Neutro Abs: 4.3 10*3/uL (ref 1.7–7.7)
Neutrophils Relative %: 61 %
Platelets: 266 10*3/uL (ref 150–400)
RBC: 5.05 MIL/uL (ref 3.87–5.11)
RDW: 13.8 % (ref 11.5–15.5)
WBC: 7.1 10*3/uL (ref 4.0–10.5)
nRBC: 0 % (ref 0.0–0.2)

## 2019-01-20 NOTE — Discharge Instructions (Signed)
Rest, fluids, over the counter tylenol/advil Await test result

## 2019-01-20 NOTE — ED Triage Notes (Signed)
Pt c/o fatigue and headache. Started about 3 days ago. She states that she has had anemia in the past. Denies fever, cough or cold symptoms.

## 2019-01-21 LAB — NOVEL CORONAVIRUS, NAA (HOSP ORDER, SEND-OUT TO REF LAB; TAT 18-24 HRS): SARS-CoV-2, NAA: NOT DETECTED

## 2019-01-25 NOTE — ED Provider Notes (Signed)
[facility]    CSN: [id number] Arrival date & time: 01/20/19  0848      History   Chief Complaint Chief Complaint  Patient presents with  . Headache  . Fatigue    HPI Christine Meadows is a 38 y.o. female.   38 yo female with a c/o fatigue and headaches for the past 3 days. Denies any fevers, chills, vision changes, numbness/tingling, cough, cold symptoms, shortness of breath, vomiting, diarrhea. States she has a h/o iron deficiency anemia.   Headache   Past Medical History:  Diagnosis Date  . Anemia   . BRCA negative 10/2017   MyRisk neg except AXIN2 VUS  . Enlarged thyroid   . Family history of breast cancer 10/2017   IBIS=18.6%  . Family history of ovarian cancer   . Vitamin D deficiency 2019    Patient Active Problem List   Diagnosis Date Noted  . Family history of ovarian cancer 11/08/2017  . 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 Big thyroid 02/27/2015    Past Surgical History:  Procedure Laterality Date  . NO PAST SURGERIES      OB History    Gravida  3   Para  2   Term  2   Preterm      AB  1   Living  2     SAB      TAB  1   Ectopic      Multiple      Live Births  2            Home Medications    Prior to Admission medications   Medication Sig Start Date End Date Taking? Authorizing Provider  albuterol (PROAIR HFA) 108 (90 Base) MCG/ACT inhaler INHALE 2 INHALATIONS INTO THE LUNGS EVERY 6 (SIX) HOURS AS NEEDED FOR WHEEZING. 10/05/14  Yes [provider]  levonorgestrel (MIRENA, 52 MG,) 20 MCG/24HR IUD 1 Intra Uterine Device (1 each total) by Intrauterine route once. 08/11/16 4/91/79  Copland, Deirdre Evener, PA-C    Family History Family History  Problem Relation Age of Onset  . Breast cancer Maternal Grandmother        62s  . Diabetes Maternal Grandmother   . Breast cancer Paternal Grandmother 61  . Diabetes Paternal Grandmother   . Colon cancer Paternal Grandfather   . Ovarian cancer  Paternal Aunt 88  . Breast cancer Paternal Aunt   . Other Father 74       tumor in pituitary gland and had it removed    Social History Social History   Tobacco Use  . Smoking status: Never Smoker  . Smokeless tobacco: Never Used  Substance Use Topics  . Alcohol use: Yes    Alcohol/week: 1.0 - 2.0 standard drinks    Types: 1 - 2 Glasses of wine per week    Comment: occasional  . Drug use: No     Allergies   Patient has no known allergies.   Review of Systems Review of Systems  Neurological: Positive for headaches.     Physical Exam Triage Vital Signs ED Triage Vitals  Enc Vitals Group     BP 01/20/19 0903 124/90     Pulse Rate 01/20/19 0903 85     Resp 01/20/19 0903 18     Temp 01/20/19 0903 98.4 F (36.9 C)     Temp Source 01/20/19 0903 Oral     SpO2 01/20/19 0903 100 %     Weight 01/20/19 0900 223 lb (  101.2 kg)     Height 01/20/19 0900 _0  (1.676 m)     Head Circumference --      Peak Flow --      Pain Score 01/20/19 0900 5     Pain Loc --      Pain Edu? --      Excl. in [place]? --    No data found.  Updated Vital Signs BP 124/90 (BP Location: Left Arm)   Pulse 85   Temp 98.4 F (36.9 C) (Oral)   Resp 18   Ht _1  (1.676 m)   Wt 101.2 kg   SpO2 100%   BMI 35.99 kg/m   Visual Acuity Right Eye Distance:   Left Eye Distance:   Bilateral Distance:    Right Eye Near:   Left Eye Near:    Bilateral Near:     Physical Exam Vitals signs reviewed.  Constitutional:      General: She is not in acute distress.    Appearance: She is not toxic-appearing.  Neck:     Musculoskeletal: Neck supple.  Cardiovascular:     Rate and Rhythm: Normal rate and regular rhythm.     Heart sounds: Normal heart sounds.  Pulmonary:     Effort: Pulmonary effort is normal. No respiratory distress.     Breath sounds: Normal breath sounds. No stridor. No wheezing, rhonchi or rales.  Neurological:     General: No focal deficit present.     Mental Status: She is  alert.      UC Treatments / Results  Labs (all labs ordered are listed, but only abnormal results are displayed) Labs Reviewed  CBC WITH DIFFERENTIAL/PLATELET - Abnormal; Notable for the following components:      Result Value   MCH 25.3 (*)    All other components within normal limits  NOVEL CORONAVIRUS, NAA (HOSP ORDER, SEND-OUT TO REF LAB; TAT 18-24 HRS)    EKG   Radiology No results found.  Procedures Procedures (including critical care time)  Medications Ordered in UC Medications - No data to display  Initial Impression / Assessment and Plan / UC Course  I have reviewed the triage vital signs and the nursing notes.  Pertinent labs & imaging results that were available during my care of the patient were reviewed by me and considered in my medical decision making (see chart for details).     Final Clinical Impressions(s) / UC Diagnoses   Final diagnoses:  Fatigue, unspecified type  Tension headache     Discharge Instructions     Rest, fluids, over the counter tylenol/advil Await test result    ED Prescriptions    None      1. Lab result and diagnosis reviewed with patient 2. Recommend supportive treatment as above 3. covid test done 4. Follow-up prn if symptoms worsen or don't improve  PDMP not reviewed this encounter.   Norval Gable, MD 01/25/19 2112

## 2019-02-06 NOTE — Progress Notes (Signed)
Chief Complaint  Patient presents with  . Gynecologic Exam     HPI:      [name] is a 38 y.o. [phone] who LMP was No LMP recorded. (Menstrual status: IUD)., presents today for her annual examination.  Her menses are infrequent with IUD. Has occas vag bleeding and cramping.   Sex activity: single partner, contraception- MIrena placed 08/11/16. No dyspareunia.  Last Pap: May 16, 2015  Results were: no abnormalities /neg HPV DNA  Hx of STDs: none  There is a FH of breast cancer in her MGM, pat aunt and PGM. There is a FH of ovarian cancer in her pat aunt. Pt is MyRisk neg 2019 except AXIN2 VUS; IBIS=18.6%.  The patient does not do self-breast exams.  Tobacco use: The patient denies current or previous tobacco use. Alcohol use: social drinker  No drug use Exercise: not active  She does get adequate calcium and Vitamin D in her diet.  She is being followed for enlarged thyroid with normal thyroid labs 8/19. Had u/s and bx in past; wanted her to have yearly thyroid labs.   Past Medical History:  Diagnosis Date  . Anemia   . BRCA negative 10/2017   MyRisk neg except AXIN2 VUS  . Enlarged thyroid   . Family history of breast cancer 10/2017   IBIS=18.6%  . Family history of ovarian cancer   . Vitamin D deficiency 2019    Past Surgical History:  Procedure Laterality Date  . NO PAST SURGERIES      Family History  Problem Relation Age of Onset  . Breast cancer Maternal Grandmother        52s  . Diabetes Maternal Grandmother   . Breast cancer Paternal Grandmother 66  . Diabetes Paternal Grandmother   . Colon cancer Paternal Grandfather   . Ovarian cancer Paternal Aunt 41  . Breast cancer Paternal Aunt        68s  . Other Father 18       tumor in pituitary gland and had it removed    Social History   Socioeconomic History  . Marital status: Married    Spouse name: Not on file  . Number of children: Not on file  . Years of education: Not  on file  . Highest education level: Not on file  Occupational History  . Not on file  Social Needs  . Financial resource strain: Not on file  . Food insecurity    Worry: Not on file    Inability: Not on file  . Transportation needs    Medical: Not on file    Non-medical: Not on file  Tobacco Use  . Smoking status: Never Smoker  . Smokeless tobacco: Never Used  Substance and Sexual Activity  . Alcohol use: Yes    Alcohol/week: 1.0 - 2.0 standard drinks    Types: 1 - 2 Glasses of wine per week    Comment: occasional  . Drug use: No  . Sexual activity: Yes    Birth control/protection: I.U.D.    Comment: Mirena  Lifestyle  . Physical activity    Days per week: Not on file    Minutes per session: Not on file  . Stress: Not on file  Relationships  . Social Herbalist on phone: Not on file    Gets together: Not on file    Attends religious service: Not on file    Active member of club or organization: Not  on file    Attends meetings of clubs or organizations: Not on file    Relationship status: Not on file  . Intimate partner violence    Fear of current or ex partner: Not on file    Emotionally abused: Not on file    Physically abused: Not on file    Forced sexual activity: Not on file  Other Topics Concern  . Not on file  Social History Narrative  . Not on file     Current Outpatient Medications:  .  albuterol (PROAIR HFA) 108 (90 Base) MCG/ACT inhaler, INHALE 2 INHALATIONS INTO THE LUNGS EVERY 6 (SIX) HOURS AS NEEDED FOR WHEEZING., Disp: , Rfl:  .  levonorgestrel (MIRENA, 52 MG,) 20 MCG/24HR IUD, 1 Intra Uterine Device (1 each total) by Intrauterine route once., Disp: 1 Intra Uterine Device, Rfl: 0  ROS:  Review of Systems  Constitutional: Negative for fatigue, fever and unexpected weight change.  Respiratory: Negative for cough, shortness of breath and wheezing.   Cardiovascular: Negative for chest pain, palpitations and leg swelling.   Gastrointestinal: Negative for blood in stool, constipation, diarrhea, nausea and vomiting.  Endocrine: Negative for cold intolerance, heat intolerance and polyuria.  Genitourinary: Negative for dyspareunia, dysuria, flank pain, frequency, genital sores, hematuria, menstrual problem, pelvic pain, urgency, vaginal bleeding, vaginal discharge and vaginal pain.  Musculoskeletal: Negative for back pain, joint swelling and myalgias.  Skin: Negative for rash.  Neurological: Negative for dizziness, syncope, light-headedness, numbness and headaches.  Hematological: Negative for adenopathy.  Psychiatric/Behavioral: Negative for agitation, confusion, sleep disturbance and suicidal ideas. The patient is not nervous/anxious.     Objective: BP 110/84   Ht 5' 6"  (1.676 m)   Wt 227 lb (103 kg)   BMI 36.64 kg/m    Physical Exam Constitutional:      Appearance: She is well-developed.  Genitourinary:     Vulva, vagina, uterus, right adnexa and left adnexa normal.     No vulval lesion or tenderness noted.     No vaginal discharge, erythema or tenderness.     No cervical motion tenderness or polyp.     IUD strings visualized.     Uterus is not enlarged or tender.     No right or left adnexal mass present.     Right adnexa not tender.     Left adnexa not tender.  Neck:     Musculoskeletal: Normal range of motion.     Thyroid: Thyromegaly present.  Cardiovascular:     Rate and Rhythm: Normal rate and regular rhythm.     Heart sounds: Normal heart sounds. No murmur.  Pulmonary:     Effort: Pulmonary effort is normal.     Breath sounds: Normal breath sounds.  Chest:     Breasts:        Right: No mass, nipple discharge, skin change or tenderness.        Left: No mass, nipple discharge, skin change or tenderness.  Abdominal:     Palpations: Abdomen is soft.     Tenderness: There is no abdominal tenderness. There is no guarding.  Musculoskeletal: Normal range of motion.  Neurological:      General: No focal deficit present.     Mental Status: She is alert and oriented to person, place, and time.     Cranial Nerves: No cranial nerve deficit.  Skin:    General: Skin is warm and dry.  Psychiatric:        Mood and Affect: Mood normal.  Behavior: Behavior normal.        Thought Content: Thought content normal.        Judgment: Judgment normal.  Vitals signs reviewed.     Assessment/Plan: Encounter for annual routine gynecological examination  Cervical cancer screening - Plan: [facility] PAP  Screening for HPV (human papillomavirus) - Plan: [facility] PAP  Encounter for routine checking of intrauterine contraceptive device (IUD); IUD in place. Due for removal after 6 yrs now.  Family history of breast cancer--pt is MyRisk neg except VUS, IBIS=18.6%  Family history of ovarian cancer-MyRisk neg, no further screening indicated.  Enlarged thyroid - Plan: TSH + free T4; check labs. Will do u/s if abn.   Thyroid disorder screening - Plan: TSH + free T4           GYN counsel adequate intake of calcium and vitamin D, diet and exercise     F/U  Return in about 1 year (around 02/07/2020).  Montrail Mehrer B. Jalena Vanderlinden, PA-C 02/07/2019 2:20 PM

## 2019-02-07 ENCOUNTER — Other Ambulatory Visit: Payer: Self-pay

## 2019-02-07 ENCOUNTER — Ambulatory Visit (INDEPENDENT_AMBULATORY_CARE_PROVIDER_SITE_OTHER): Payer: No Typology Code available for payment source | Admitting: Obstetrics and Gynecology

## 2019-02-07 ENCOUNTER — Encounter: Payer: Self-pay | Admitting: Obstetrics and Gynecology

## 2019-02-07 ENCOUNTER — Other Ambulatory Visit (HOSPITAL_COMMUNITY)
Admission: RE | Admit: 2019-02-07 | Discharge: 2019-02-07 | Disposition: A | Discharge status: Home / Self Care | Payer: No Typology Code available for payment source | Source: Ambulatory Visit | Attending: Obstetrics and Gynecology | Admitting: Obstetrics and Gynecology

## 2019-02-07 ENCOUNTER — Other Ambulatory Visit: Payer: Self-pay | Admitting: Obstetrics and Gynecology

## 2019-02-07 VITALS — BP 110/84 | Ht 66.0 in | Wt 227.0 lb

## 2019-02-07 DIAGNOSIS — Z124 Encounter for screening for malignant neoplasm of cervix: Secondary | ICD-10-CM | POA: Diagnosis not present

## 2019-02-07 DIAGNOSIS — Z1151 Encounter for screening for human papillomavirus (HPV): Secondary | ICD-10-CM

## 2019-02-07 DIAGNOSIS — Z30431 Encounter for routine checking of intrauterine contraceptive device: Secondary | ICD-10-CM

## 2019-02-07 DIAGNOSIS — Z1329 Encounter for screening for other suspected endocrine disorder: Secondary | ICD-10-CM

## 2019-02-07 DIAGNOSIS — Z01419 Encounter for gynecological examination (general) (routine) without abnormal findings: Secondary | ICD-10-CM

## 2019-02-07 DIAGNOSIS — Z803 Family history of malignant neoplasm of breast: Secondary | ICD-10-CM

## 2019-02-07 DIAGNOSIS — E049 Nontoxic goiter, unspecified: Secondary | ICD-10-CM

## 2019-02-07 DIAGNOSIS — Z8041 Family history of malignant neoplasm of ovary: Secondary | ICD-10-CM

## 2019-02-07 NOTE — Patient Instructions (Signed)
I value your feedback and entrusting us with your care. If you get a [facility] patient survey, I would appreciate you taking the time to let us know about your experience today. Thank you! 

## 2019-02-08 LAB — TSH+FREE T4
Free T4: 1.28 ng/dL (ref 0.82–1.77)
TSH: 0.888 u[IU]/mL (ref 0.450–4.500)

## 2019-02-14 LAB — CYTOLOGY - PAP
Comment: NEGATIVE
Diagnosis: NEGATIVE
Diagnosis: REACTIVE
High risk HPV: NEGATIVE

## 2019-03-27 ENCOUNTER — Encounter: Payer: Self-pay | Admitting: Obstetrics and Gynecology

## 2019-05-10 ENCOUNTER — Ambulatory Visit: Payer: No Typology Code available for payment source

## 2019-05-10 ENCOUNTER — Encounter: Payer: Self-pay | Admitting: Obstetrics and Gynecology

## 2019-05-10 NOTE — Telephone Encounter (Signed)
Pls cancel appt.

## 2019-06-27 ENCOUNTER — Other Ambulatory Visit: Payer: Self-pay

## 2019-06-27 ENCOUNTER — Ambulatory Visit
Admission: EM | Admit: 2019-06-27 | Discharge: 2019-06-27 | Disposition: A | Discharge status: Home / Self Care | Payer: No Typology Code available for payment source | Attending: Emergency Medicine | Admitting: Emergency Medicine

## 2019-06-27 ENCOUNTER — Encounter: Payer: Self-pay | Admitting: Emergency Medicine

## 2019-06-27 DIAGNOSIS — N3001 Acute cystitis with hematuria: Secondary | ICD-10-CM | POA: Diagnosis not present

## 2019-06-27 LAB — URINALYSIS, COMPLETE (UACMP) WITH MICROSCOPIC
Bilirubin Urine: NEGATIVE
Glucose, UA: NEGATIVE mg/dL
Ketones, ur: NEGATIVE mg/dL
Nitrite: POSITIVE — AB
Protein, ur: 30 mg/dL — AB
Specific Gravity, Urine: 1.02 (ref 1.005–1.030)
WBC, UA: >50 WBC/hpf (ref 0–5)
pH: 5.5 (ref 5.0–8.0)

## 2019-06-27 MED ORDER — PHENAZOPYRIDINE HCL 200 MG PO TABS: 200.0000 mg | ORAL_TABLET | Freq: Three times a day (TID) | ORAL | 0 refills | Status: DC | PRN

## 2019-06-27 MED ORDER — NITROFURANTOIN MONOHYD MACRO 100 MG PO CAPS: 100.0000 mg | ORAL_CAPSULE | Freq: Two times a day (BID) | ORAL | 0 refills | Status: DC

## 2019-06-27 NOTE — ED Triage Notes (Signed)
Patient c/o urinary frequency and urgency that started 4 days ago.

## 2019-06-27 NOTE — ED Provider Notes (Signed)
HPI  SUBJECTIVE:  Christine Meadows is a 39 y.o. female who presents with 4 days of urinary urgency frequency cloudy odorous urine.  She reports low midline abdominal cramping similar to menstrual cramps starting last night.  She tried a heating pad with improvement in the cramping.  Symptoms are worse when she has the urge to urinate.  No dysuria hematuria.  No vaginal odor itching bleeding discharge rash.  No back pain.  No body aches fevers nausea vomiting.  No antibiotics in the past month.  No antipyretic in the past 4 to 6 hours.  She is in a long-term monogamous relationship with her husband who is asymptomatic.  STDs are not a concern today.  She has a past medical history of a UTI in February which was treated with Macrobid.  She has a remote history of BV.  No history of diabetes hypertension pyelonephritis nephrolithiasis.  No history of gonorrhea chlamydia HIV HSV trichomonas syphilis or yeast infections.  LMP: Amenorrheic.  Has an IUD.  Denies the possibility of being pregnant.  GYB:[id number], Lupe Carney   Past Medical History:  Diagnosis Date  . Anemia   . BRCA negative 10/2017   MyRisk neg except AXIN2 VUS  . Enlarged thyroid   . Family history of breast cancer 10/2017   IBIS=18.6%  . Family history of ovarian cancer   . Vitamin D deficiency 2019    Past Surgical History:  Procedure Laterality Date  . NO PAST SURGERIES      Family History  Problem Relation Age of Onset  . Breast cancer Maternal Grandmother        37s  . Diabetes Maternal Grandmother   . Breast cancer Paternal Grandmother 36  . Diabetes Paternal Grandmother   . Colon cancer Paternal Grandfather   . Ovarian cancer Paternal Aunt 35  . Breast cancer Paternal Aunt        66s  . Other Father 21       tumor in pituitary gland and had it removed    Social History   Tobacco Use  . Smoking status: Never Smoker  . Smokeless tobacco: Never Used  Substance Use Topics  . Alcohol use: Yes     Alcohol/week: 1.0 - 2.0 standard drinks    Types: 1 - 2 Glasses of wine per week    Comment: occasional  . Drug use: No    No current facility-administered medications for this encounter.  Current Outpatient Medications:  .  albuterol (PROAIR HFA) 108 (90 Base) MCG/ACT inhaler, INHALE 2 INHALATIONS INTO THE LUNGS EVERY 6 (SIX) HOURS AS NEEDED FOR WHEEZING., Disp: , Rfl:  .  levonorgestrel (MIRENA, 52 MG,) 20 MCG/24HR IUD, 1 Intra Uterine Device (1 each total) by Intrauterine route once., Disp: 1 Intra Uterine Device, Rfl: 0 .  nitrofurantoin, macrocrystal-monohydrate, (MACROBID) 100 MG capsule, Take 1 capsule (100 mg total) by mouth 2 (two) times daily. X 5 days, Disp: 10 capsule, Rfl: 0 .  phenazopyridine (PYRIDIUM) 200 MG tablet, Take 1 tablet (200 mg total) by mouth 3 (three) times daily as needed for pain., Disp: 6 tablet, Rfl: 0  No Known Allergies   ROS  As noted in HPI.   Physical Exam  BP 111/79 (BP Location: Left Arm)   Pulse 74   Temp 98.4 F (36.9 C) (Oral)   Resp 18   Ht _0  (1.651 m)   Wt 103.4 kg   SpO2 99%   BMI 37.94 kg/m   Constitutional: Well developed,  well nourished, no acute distress Eyes:  EOMI, conjunctiva normal bilaterally HENT: Normocephalic, atraumatic,mucus membranes moist Respiratory: Normal inspiratory effort Cardiovascular: Normal rate GI: nondistended positive suprapubic tenderness.  No flank tenderness. Back: No CVA tenderness skin: No rash, skin intact Musculoskeletal:  Neurologic: Alert & oriented x 3, no focal neuro deficits Psychiatric: Speech and behavior appropriate   ED Course   Medications - No data to display  Orders Placed This Encounter  Procedures  . Urine culture    Standing Status:   Standing    Number of Occurrences:   1    Order Specific Question:   List patient's active antibiotics    Answer:   macrobid  . Urinalysis, Complete w Microscopic    Standing Status:   Standing    Number of Occurrences:   1     Results for orders placed or performed during the hospital encounter of 06/27/19 (from the past 24 hour(s))  Urinalysis, Complete w Microscopic     Status: Abnormal   Collection Time: 06/27/19  8:54 AM  Result Value Ref Range   Color, Urine AMBER (A) YELLOW   APPearance CLOUDY (A) CLEAR   Specific Gravity, Urine 1.020 1.005 - 1.030   pH 5.5 5.0 - 8.0   Glucose, UA NEGATIVE NEGATIVE mg/dL   Hgb urine dipstick MODERATE (A) NEGATIVE   Bilirubin Urine NEGATIVE NEGATIVE   Ketones, ur NEGATIVE NEGATIVE mg/dL   Protein, ur 30 (A) NEGATIVE mg/dL   Nitrite POSITIVE (A) NEGATIVE   Leukocytes,Ua LARGE (A) NEGATIVE   Squamous Epithelial / LPF 6-10 0 - 5   WBC, UA >50 0 - 5 WBC/hpf   RBC / HPF 11-20 0 - 5 RBC/hpf   Bacteria, UA MANY (A) NONE SEEN   No results found.  ED Clinical Impression  1. Acute cystitis with hematuria      ED Assessment/Plan  No previous urine cultures available at [facility] or through care everywhere.  H&P, UA consistent with UTI.  Will send home with Pyridium Macrobid.  Sending urine off for culture to confirm antibiotic choice.  Follow-up with PMD as needed, to the ER if she gets worse.  Discussed labs,  MDM, treatment plan, and plan for follow-up with patient. Discussed sn/sx that should prompt return to the ED. patient agrees with plan.   Meds ordered this encounter  Medications  . phenazopyridine (PYRIDIUM) 200 MG tablet    Sig: Take 1 tablet (200 mg total) by mouth 3 (three) times daily as needed for pain.    Dispense:  6 tablet    Refill:  0  . nitrofurantoin, macrocrystal-monohydrate, (MACROBID) 100 MG capsule    Sig: Take 1 capsule (100 mg total) by mouth 2 (two) times daily. X 5 days    Dispense:  10 capsule    Refill:  0    *This clinic note was created using Lobbyist. Therefore, there may be occasional mistakes despite careful proofreading.   ?    Melynda Ripple, MD 06/27/19 1023

## 2019-06-27 NOTE — Discharge Instructions (Addendum)
Your urine is consistent with a urinary tract infection.  I have sent it off for culture to make sure that you are on the right antibiotic.  Finish the [employer], even if you feel better.  The Pyridium will help with your symptoms.  Push plenty of extra fluids

## 2019-06-29 LAB — URINE CULTURE: Culture: >=100000 — AB

## 2019-09-08 ENCOUNTER — Other Ambulatory Visit: Payer: Self-pay

## 2019-09-08 ENCOUNTER — Ambulatory Visit
Admission: EM | Admit: 2019-09-08 | Discharge: 2019-09-08 | Disposition: A | Discharge status: Home / Self Care | Payer: No Typology Code available for payment source | Attending: Physician Assistant | Admitting: Physician Assistant

## 2019-09-08 DIAGNOSIS — N390 Urinary tract infection, site not specified: Secondary | ICD-10-CM | POA: Diagnosis not present

## 2019-09-08 LAB — URINALYSIS, COMPLETE (UACMP) WITH MICROSCOPIC
Bilirubin Urine: NEGATIVE
Glucose, UA: NEGATIVE mg/dL
Nitrite: POSITIVE — AB
Protein, ur: 100 mg/dL — AB
Specific Gravity, Urine: 1.025 (ref 1.005–1.030)
WBC, UA: >50 WBC/hpf (ref 0–5)
pH: 5.5 (ref 5.0–8.0)

## 2019-09-08 MED ORDER — CEPHALEXIN 500 MG PO CAPS: 500.0000 mg | ORAL_CAPSULE | Freq: Four times a day (QID) | ORAL | 0 refills | Status: AC

## 2019-09-08 NOTE — ED Triage Notes (Signed)
Patient complains of urinary frequency, urgency with cloudy urine. Patient states that she has recently had STD labs and that was normal. Patient states that she has UTIs frequently and grew out e. Coli last.

## 2019-09-08 NOTE — Discharge Instructions (Signed)
Schedule follow up with Urology  °

## 2019-09-10 NOTE — ED Provider Notes (Signed)
[facility]    CSN: [id number] Arrival date & time: 09/08/19  1453      History   Chief Complaint Chief Complaint  Patient presents with  . Urinary Frequency    HPI Christine Meadows is a 39 y.o. female.   The history is provided by the patient. No language interpreter was used.  Urinary Frequency This is a new problem. The current episode started yesterday. The problem has not changed since onset.Nothing relieves the symptoms.  Pt complains of burning with urination and frequency.   Past Medical History:  Diagnosis Date  . Anemia   . BRCA negative 10/2017   MyRisk neg except AXIN2 VUS  . Enlarged thyroid   . Family history of breast cancer 10/2017   IBIS=18.6%  . Family history of ovarian cancer   . Vitamin D deficiency 2019    Patient Active Problem List   Diagnosis Date Noted  . Family history of breast cancer 02/07/2019  . Family history of ovarian cancer 11/08/2017  . 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 Enlarged thyroid 02/27/2015    Past Surgical History:  Procedure Laterality Date  . NO PAST SURGERIES      OB History    Gravida  3   Para  2   Term  2   Preterm      AB  1   Living  2     SAB      TAB  1   Ectopic      Multiple      Live Births  2            Home Medications    Prior to Admission medications   Medication Sig Start Date End Date Taking? Authorizing Provider  albuterol (PROAIR HFA) 108 (90 Base) MCG/ACT inhaler INHALE 2 INHALATIONS INTO THE LUNGS EVERY 6 (SIX) HOURS AS NEEDED FOR WHEEZING. 10/05/14  Yes [provider]  levonorgestrel (MIRENA, 52 MG,) 20 MCG/24HR IUD 1 Intra Uterine Device (1 each total) by Intrauterine route once. 08/11/16 11/15/03 Yes Copland, Deirdre Evener, PA-C  cephALEXin (KEFLEX) 500 MG capsule Take 1 capsule (500 mg total) by mouth 4 (four) times daily for 10 days. 09/08/19 09/18/19  Fransico Meadow, PA-C  nitrofurantoin, macrocrystal-monohydrate, (MACROBID) 100 MG  capsule Take 1 capsule (100 mg total) by mouth 2 (two) times daily. X 5 days 06/27/19   Melynda Ripple, MD  phenazopyridine (PYRIDIUM) 200 MG tablet Take 1 tablet (200 mg total) by mouth 3 (three) times daily as needed for pain. 06/27/19   Melynda Ripple, MD    Family History Family History  Problem Relation Age of Onset  . Breast cancer Maternal Grandmother        6s  . Diabetes Maternal Grandmother   . Breast cancer Paternal Grandmother 66  . Diabetes Paternal Grandmother   . Colon cancer Paternal Grandfather   . Ovarian cancer Paternal Aunt 14  . Breast cancer Paternal Aunt        47s  . Other Father 4       tumor in pituitary gland and had it removed    Social History Social History   Tobacco Use  . Smoking status: Never Smoker  . Smokeless tobacco: Never Used  Vaping Use  . Vaping Use: Never used  Substance Use Topics  . Alcohol use: Yes    Alcohol/week: 1.0 - 2.0 standard drink    Types: 1 - 2 Glasses of wine per week    Comment:  occasional  . Drug use: No     Allergies   Patient has no known allergies.   Review of Systems Review of Systems  Genitourinary: Positive for frequency.  All other systems reviewed and are negative.    Physical Exam Triage Vital Signs ED Triage Vitals  Enc Vitals Group     BP 09/08/19 1545 124/88     Pulse Rate 09/08/19 1545 72     Resp 09/08/19 1545 18     Temp 09/08/19 1545 98.5 F (36.9 C)     Temp Source 09/08/19 1545 Oral     SpO2 09/08/19 1545 100 %     Weight 09/08/19 1542 227 lb 1.2 oz (103 kg)     Height 09/08/19 1542 _0  (1.651 m)     Head Circumference --      Peak Flow --      Pain Score 09/08/19 1542 0     Pain Loc --      Pain Edu? --      Excl. in [place]? --    No data found.  Updated Vital Signs BP 124/88 (BP Location: Left Arm)   Pulse 72   Temp 98.5 F (36.9 C) (Oral)   Resp 18   Ht _1  (1.651 m)   Wt 103 kg   SpO2 100%   BMI 37.79 kg/m   Visual Acuity Right Eye Distance:   Left  Eye Distance:   Bilateral Distance:    Right Eye Near:   Left Eye Near:    Bilateral Near:     Physical Exam Vitals and nursing note reviewed.  Constitutional:      Appearance: She is well-developed.  HENT:     Head: Normocephalic.  Cardiovascular:     Rate and Rhythm: Normal rate and regular rhythm.  Pulmonary:     Effort: Pulmonary effort is normal.  Abdominal:     General: There is no distension.  Musculoskeletal:        General: Normal range of motion.     Cervical back: Normal range of motion.  Skin:    General: Skin is warm.  Neurological:     Mental Status: She is alert and oriented to person, place, and time.  Psychiatric:        Mood and Affect: Mood normal.      UC Treatments / Results  Labs (all labs ordered are listed, but only abnormal results are displayed) Labs Reviewed  URINE CULTURE - Abnormal; Notable for the following components:      Result Value   Culture   (*)    Value: >=100,000 COLONIES/mL ESCHERICHIA COLI SUSCEPTIBILITIES TO FOLLOW Performed at [facility], [address], [place], [place] [address]    All other components within normal limits  URINALYSIS, COMPLETE (UACMP) WITH MICROSCOPIC - Abnormal; Notable for the following components:   APPearance CLOUDY (*)    Hgb urine dipstick MODERATE (*)    Ketones, ur TRACE (*)    Protein, ur 100 (*)    Nitrite POSITIVE (*)    Leukocytes,Ua SMALL (*)    Bacteria, UA MANY (*)    All other components within normal limits    EKG   Radiology No results found.  Procedures Procedures (including critical care time)  Medications Ordered in UC Medications - No data to display  Initial Impression / Assessment and Plan / UC Course  I have reviewed the triage vital signs and the nursing notes.  Pertinent labs &  imaging results that were available during my care of the patient were reviewed by me and considered in my medical decision making (see chart for details).     MDM:  Pt  counseled on uti's  Final Clinical Impressions(s) / UC Diagnoses   Final diagnoses:  Urinary tract infection without hematuria, site unspecified     Discharge Instructions     Schedule follow up with Urology.    ED Prescriptions    Medication Sig Dispense Auth. Provider   cephALEXin (KEFLEX) 500 MG capsule Take 1 capsule (500 mg total) by mouth 4 (four) times daily for 10 days. 40 capsule Fransico Meadow, [place]     PDMP not reviewed this encounter.  An After Visit Summary was printed and given to the patient.    Fransico Meadow, [place] 09/10/19 1824

## 2019-09-11 LAB — URINE CULTURE: Culture: >=100000 — AB

## 2020-02-07 NOTE — Progress Notes (Signed)
Chief Complaint  Patient presents with  . Gynecologic Exam     HPI:      [name] is a 39 y.o. [phone] who LMP was No LMP recorded. (Menstrual status: IUD)., presents today for her annual examination.  Her menses are infrequent with IUD. Has occas vag bleeding and cramping.   Sex activity: single partner, contraception- MIrena placed 08/11/16. No dyspareunia.  Last Pap: 02/07/19  Results were: no abnormalities /neg HPV DNA  Hx of STDs: none  There is a FH of breast cancer in her MGM, pat aunt and PGM. There is a FH of ovarian cancer in her pat aunt. Pt is MyRisk neg 2019 except AXIN2 VUS; IBIS=18.6%.  The patient does not do self-breast exams.  Tobacco use: The patient denies current or previous tobacco use. Alcohol use: social drinker  No drug use Exercise: not active  She does get adequate calcium and Vitamin D in her diet.  She is being followed for enlarged thyroid with normal thyroid labs 11/20 . Had u/s and bx in past; wanted her to have yearly thyroid labs. Feels like thyroid has gotten larger. No trouble swallowing/no sx.  Past Medical History:  Diagnosis Date  . Anemia   . BRCA negative 10/2017   MyRisk neg except AXIN2 VUS  . Enlarged thyroid   . Family history of breast cancer 10/2017   IBIS=18.6%  . Family history of ovarian cancer   . Vitamin D deficiency 2019    Past Surgical History:  Procedure Laterality Date  . NO PAST SURGERIES      Family History  Problem Relation Age of Onset  . Breast cancer Maternal Grandmother        63s  . Diabetes Maternal Grandmother   . Breast cancer Paternal Grandmother 97  . Diabetes Paternal Grandmother   . Colon cancer Paternal Grandfather   . Ovarian cancer Paternal Aunt 61  . Breast cancer Paternal Aunt        76s  . Other Father 24       tumor in pituitary gland and had it removed    Social History   Socioeconomic History  . Marital status: Married    Spouse name: Not on file  .  Number of children: Not on file  . Years of education: Not on file  . Highest education level: Not on file  Occupational History  . Not on file  Tobacco Use  . Smoking status: Never Smoker  . Smokeless tobacco: Never Used  Vaping Use  . Vaping Use: Never used  Substance and Sexual Activity  . Alcohol use: Yes    Alcohol/week: 1.0 - 2.0 standard drink    Types: 1 - 2 Glasses of wine per week    Comment: occasional  . Drug use: No  . Sexual activity: Yes    Birth control/protection: I.U.D.    Comment: Mirena  Other Topics Concern  . Not on file  Social History Narrative  . Not on file   Social Determinants of Health   Financial Resource Strain:   . Difficulty of Paying Living Expenses: Not on file  Food Insecurity:   . Worried About Charity fundraiser in the Last Year: Not on file  . Ran Out of Food in the Last Year: Not on file  Transportation Needs:   . Lack of Transportation (Medical): Not on file  . Lack of Transportation (Non-Medical): Not on file  Physical Activity:   . Days of  Exercise per Week: Not on file  . Minutes of Exercise per Session: Not on file  Stress:   . Feeling of Stress : Not on file  Social Connections:   . Frequency of Communication with Friends and Family: Not on file  . Frequency of Social Gatherings with Friends and Family: Not on file  . Attends Religious Services: Not on file  . Active Member of Clubs or Organizations: Not on file  . Attends Archivist Meetings: Not on file  . Marital Status: Not on file  Intimate Partner Violence:   . Fear of Current or Ex-Partner: Not on file  . Emotionally Abused: Not on file  . Physically Abused: Not on file  . Sexually Abused: Not on file     Current Outpatient Medications:  .  albuterol (PROAIR HFA) 108 (90 Base) MCG/ACT inhaler, INHALE 2 INHALATIONS INTO THE LUNGS EVERY 6 (SIX) HOURS AS NEEDED FOR WHEEZING., Disp: , Rfl:  .  budesonide (PULMICORT) 0.5 MG/2ML nebulizer solution,  Inhale into the lungs., Disp: , Rfl:  .  levonorgestrel (MIRENA, 52 MG,) 20 MCG/24HR IUD, 1 Intra Uterine Device (1 each total) by Intrauterine route once., Disp: 1 Intra Uterine Device, Rfl: 0  ROS:  Review of Systems  Constitutional: Negative for fatigue, fever and unexpected weight change.  Respiratory: Negative for cough, shortness of breath and wheezing.   Cardiovascular: Negative for chest pain, palpitations and leg swelling.  Gastrointestinal: Negative for blood in stool, constipation, diarrhea, nausea and vomiting.  Endocrine: Negative for cold intolerance, heat intolerance and polyuria.  Genitourinary: Negative for dyspareunia, dysuria, flank pain, frequency, genital sores, hematuria, menstrual problem, pelvic pain, urgency, vaginal bleeding, vaginal discharge and vaginal pain.  Musculoskeletal: Negative for back pain, joint swelling and myalgias.  Skin: Negative for rash.  Neurological: Negative for dizziness, syncope, light-headedness, numbness and headaches.  Hematological: Negative for adenopathy.  Psychiatric/Behavioral: Negative for agitation, confusion, sleep disturbance and suicidal ideas. The patient is not nervous/anxious.     Objective: BP 110/80   Ht $R'5\' 6"'ke$  (1.676 m)   Wt 233 lb (105.7 kg)   BMI 37.61 kg/m    Physical Exam Constitutional:      Appearance: She is well-developed.  Genitourinary:     Vulva, vagina, uterus, right adnexa and left adnexa normal.     No vulval lesion or tenderness noted.     No vaginal discharge, erythema or tenderness.     No cervical motion tenderness or polyp.     IUD strings visualized.     Uterus is not enlarged or tender.     No right or left adnexal mass present.     Right adnexa not tender.     Left adnexa not tender.  Neck:     Thyroid: Thyromegaly present.  Cardiovascular:     Rate and Rhythm: Normal rate and regular rhythm.     Heart sounds: Normal heart sounds. No murmur heard.   Pulmonary:     Effort: Pulmonary  effort is normal.     Breath sounds: Normal breath sounds.  Chest:     Breasts:        Right: No mass, nipple discharge, skin change or tenderness.        Left: No mass, nipple discharge, skin change or tenderness.  Abdominal:     Palpations: Abdomen is soft.     Tenderness: There is no abdominal tenderness. There is no guarding.  Musculoskeletal:        General: Normal range  of motion.     Cervical back: Normal range of motion.  Neurological:     General: No focal deficit present.     Mental Status: She is alert and oriented to person, place, and time.     Cranial Nerves: No cranial nerve deficit.  Skin:    General: Skin is warm and dry.  Psychiatric:        Mood and Affect: Mood normal.        Behavior: Behavior normal.        Thought Content: Thought content normal.        Judgment: Judgment normal.  Vitals reviewed.     Assessment/Plan: Encounter for annual routine gynecological examination  Encounter for routine checking of intrauterine contraceptive device (IUD)--IUD strings in place, due for removal 5/25.   Family history of breast cancer--pt is MyRisk neg, start mammos age 64  Family history of ovarian cancer  Enlarged thyroid - Plan: TSH + free T4, US THYROID; thyromegaly on exam, getting larger per pt. Check labs and u/s. Will f/u with results.  Thyroid disorder screening - Plan: TSH + free T4  Blood tests for routine general physical examination - Plan: Comprehensive metabolic panel, Lipid panel, Hemoglobin A1c, TSH + free T4  Screening cholesterol level - Plan: Lipid panel  Screening for diabetes mellitus - Plan: Hemoglobin A1c  GYN counsel breast self exam, mammography screening, adequate intake of calcium and vitamin D, diet and exercise    F/U  Return in about 1 year (around 02/07/2021).  Antia Rahal B. Tesha Archambeau, PA-C 02/08/2020 9:02 AM

## 2020-02-08 ENCOUNTER — Ambulatory Visit: Payer: No Typology Code available for payment source | Admitting: Obstetrics and Gynecology

## 2020-02-08 ENCOUNTER — Encounter: Payer: Self-pay | Admitting: Obstetrics and Gynecology

## 2020-02-08 ENCOUNTER — Other Ambulatory Visit: Payer: Self-pay

## 2020-02-08 ENCOUNTER — Ambulatory Visit (INDEPENDENT_AMBULATORY_CARE_PROVIDER_SITE_OTHER): Payer: No Typology Code available for payment source | Admitting: Obstetrics and Gynecology

## 2020-02-08 VITALS — BP 110/80 | Ht 66.0 in | Wt 233.0 lb

## 2020-02-08 DIAGNOSIS — Z8041 Family history of malignant neoplasm of ovary: Secondary | ICD-10-CM | POA: Diagnosis not present

## 2020-02-08 DIAGNOSIS — Z01419 Encounter for gynecological examination (general) (routine) without abnormal findings: Secondary | ICD-10-CM | POA: Diagnosis not present

## 2020-02-08 DIAGNOSIS — Z803 Family history of malignant neoplasm of breast: Secondary | ICD-10-CM | POA: Diagnosis not present

## 2020-02-08 DIAGNOSIS — E049 Nontoxic goiter, unspecified: Secondary | ICD-10-CM

## 2020-02-08 DIAGNOSIS — Z30431 Encounter for routine checking of intrauterine contraceptive device: Secondary | ICD-10-CM | POA: Diagnosis not present

## 2020-02-08 DIAGNOSIS — Z131 Encounter for screening for diabetes mellitus: Secondary | ICD-10-CM

## 2020-02-08 DIAGNOSIS — Z1329 Encounter for screening for other suspected endocrine disorder: Secondary | ICD-10-CM

## 2020-02-08 DIAGNOSIS — Z0000 Encounter for general adult medical examination without abnormal findings: Secondary | ICD-10-CM

## 2020-02-08 DIAGNOSIS — Z13220 Encounter for screening for lipoid disorders: Secondary | ICD-10-CM

## 2020-02-08 NOTE — Patient Instructions (Signed)
I value your feedback and entrusting us with your care. If you get a [facility] patient survey, I would appreciate you taking the time to let us know about your experience today. Thank you!  As of March 02, 2019, your lab results will be released to your MyChart immediately, before I even have a chance to see them. Please give me time to review them and contact you if there are any abnormalities. Thank you for your patience.  

## 2020-02-09 ENCOUNTER — Encounter: Payer: Self-pay | Admitting: Obstetrics and Gynecology

## 2020-02-09 LAB — COMPREHENSIVE METABOLIC PANEL
ALT: 19 IU/L (ref 0–32)
AST: 14 IU/L (ref 0–40)
Albumin/Globulin Ratio: 1.9 (ref 1.2–2.2)
Albumin: 4.5 g/dL (ref 3.8–4.8)
Alkaline Phosphatase: 84 IU/L (ref 44–121)
BUN/Creatinine Ratio: 18 (ref 9–23)
BUN: 16 mg/dL (ref 6–20)
Bilirubin Total: 0.6 mg/dL (ref 0.0–1.2)
CO2: 23 mmol/L (ref 20–29)
Calcium: 9.5 mg/dL (ref 8.7–10.2)
Chloride: 103 mmol/L (ref 96–106)
Creatinine, Ser: 0.87 mg/dL (ref 0.57–1.00)
GFR calc Af Amer: 97 mL/min/{1.73_m2} (ref >59)
GFR calc non Af Amer: 84 mL/min/{1.73_m2} (ref >59)
Globulin, Total: 2.4 g/dL (ref 1.5–4.5)
Glucose: 91 mg/dL (ref 65–99)
Potassium: 4.3 mmol/L (ref 3.5–5.2)
Sodium: 138 mmol/L (ref 134–144)
Total Protein: 6.9 g/dL (ref 6.0–8.5)

## 2020-02-09 LAB — LIPID PANEL
Chol/HDL Ratio: 3 ratio (ref 0.0–4.4)
Cholesterol, Total: 169 mg/dL (ref 100–199)
HDL: 56 mg/dL (ref >39)
LDL Chol Calc (NIH): 103 mg/dL — ABNORMAL HIGH (ref 0–99)
Triglycerides: 48 mg/dL (ref 0–149)
VLDL Cholesterol Cal: 10 mg/dL (ref 5–40)

## 2020-02-09 LAB — HEMOGLOBIN A1C
Est. average glucose Bld gHb Est-mCnc: 111 mg/dL
Hgb A1c MFr Bld: 5.5 % (ref 4.8–5.6)

## 2020-02-09 LAB — TSH+FREE T4
Free T4: 1.14 ng/dL (ref 0.82–1.77)
TSH: 0.526 u[IU]/mL (ref 0.450–4.500)

## 2020-02-10 NOTE — Telephone Encounter (Signed)
Any info on thyroid u/s appt? Thx

## 2020-02-12 NOTE — Telephone Encounter (Signed)
Scheduled for [facility] on 11/30 - will call pt to adv

## 2020-02-20 ENCOUNTER — Ambulatory Visit
Admission: RE | Admit: 2020-02-20 | Discharge: 2020-02-20 | Disposition: A | Discharge status: Home / Self Care | Payer: BC Managed Care – PPO | Source: Ambulatory Visit | Attending: Obstetrics and Gynecology | Admitting: Obstetrics and Gynecology

## 2020-02-20 ENCOUNTER — Ambulatory Visit: Payer: No Typology Code available for payment source

## 2020-02-20 ENCOUNTER — Other Ambulatory Visit: Payer: Self-pay

## 2020-02-20 ENCOUNTER — Ambulatory Visit: Admission: RE | Admit: 2020-02-20 | Payer: BC Managed Care – PPO | Source: Ambulatory Visit

## 2020-02-20 DIAGNOSIS — E049 Nontoxic goiter, unspecified: Secondary | ICD-10-CM | POA: Insufficient documentation

## 2020-02-26 ENCOUNTER — Encounter: Payer: Self-pay | Admitting: Emergency Medicine

## 2020-02-26 ENCOUNTER — Ambulatory Visit
Admission: EM | Admit: 2020-02-26 | Discharge: 2020-02-26 | Disposition: A | Discharge status: Home / Self Care | Payer: BC Managed Care – PPO

## 2020-02-26 ENCOUNTER — Other Ambulatory Visit: Payer: Self-pay

## 2020-02-26 DIAGNOSIS — G44209 Tension-type headache, unspecified, not intractable: Secondary | ICD-10-CM | POA: Diagnosis not present

## 2020-02-26 NOTE — Discharge Instructions (Addendum)
You can use turmeric, 1000 mg twice daily, as needed for pain and inflammation.  Inquire about having an ultraviolet light filter installed on your computer to help cut down on clear.  Go back to your new prescription and wear them for increasing periods until your eyes adjust.  Take frequent breaks from your workstation so that you limit your eyestrain.  Massage therapy can help with the muscle tension in your neck.  If any new or worsening symptoms develop return for reevaluation or see your primary care provider.

## 2020-02-26 NOTE — ED Triage Notes (Addendum)
Patient states for the past week she has had pressure in her head. She denies any sinus symptoms. She has not tried anything OTC for her symptoms.

## 2020-02-26 NOTE — ED Provider Notes (Signed)
[facility]    CSN: [id number] Arrival date & time: 02/26/20  1802      History   Chief Complaint Chief Complaint  Patient presents with  . Headache    HPI Christine Meadows is a 39 y.o. female.   HPI   39 year old female here for evaluation of headache that she has had for the last week.  Patient reports that she used states this feels like the headache is behind her eyes and at the base of her skull.  Patient denies any sinus pain or pressure, runny nose, ear pain or pressure, ringing in ears, change in her vision, and has not taken any medications to try and help.  Patient does report that she got new glasses and she thought that might be the issue but she went back to her old glasses and things have not helped.  Patient does spend a lot of time in front of a computer and does not have a UV filter.  Patient is tried increasing her water intake which has not helped either.  Past Medical History:  Diagnosis Date  . Anemia   . BRCA negative 10/2017   MyRisk neg except AXIN2 VUS  . Enlarged thyroid   . Family history of breast cancer 10/2017   IBIS=18.6%  . Family history of ovarian cancer   . Vitamin D deficiency 2019    Patient Active Problem List   Diagnosis Date Noted  . Family history of breast cancer 02/07/2019  . Family history of ovarian cancer 11/08/2017  . 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 Enlarged thyroid 02/27/2015    Past Surgical History:  Procedure Laterality Date  . NO PAST SURGERIES      OB History    Gravida  3   Para  2   Term  2   Preterm      AB  1   Living  2     SAB      TAB  1   Ectopic      Multiple      Live Births  2            Home Medications    Prior to Admission medications   Medication Sig Start Date End Date Taking? Authorizing Provider  albuterol (PROAIR HFA) 108 (90 Base) MCG/ACT inhaler INHALE 2 INHALATIONS INTO THE LUNGS EVERY 6 (SIX) HOURS AS NEEDED FOR WHEEZING. 10/05/14   Yes [provider]  budesonide (PULMICORT) 0.5 MG/2ML nebulizer solution Inhale into the lungs. 04/07/19 04/06/20 Yes [provider]  levonorgestrel (MIRENA, 52 MG,) 20 MCG/24HR IUD 1 Intra Uterine Device (1 each total) by Intrauterine route once. 08/11/16 9/38/18  Copland, Deirdre Evener, PA-C    Family History Family History  Problem Relation Age of Onset  . Breast cancer Maternal Grandmother        41s  . Diabetes Maternal Grandmother   . Breast cancer Paternal Grandmother 35  . Diabetes Paternal Grandmother   . Colon cancer Paternal Grandfather   . Ovarian cancer Paternal Aunt 65  . Breast cancer Paternal Aunt        4s  . Other Father 35       tumor in pituitary gland and had it removed    Social History Social History   Tobacco Use  . Smoking status: Never Smoker  . Smokeless tobacco: Never Used  Vaping Use  . Vaping Use: Never used  Substance Use Topics  . Alcohol use: Yes  Alcohol/week: 1.0 - 2.0 standard drink    Types: 1 - 2 Glasses of wine per week    Comment: occasional  . Drug use: No     Allergies   Patient has no known allergies.   Review of Systems Review of Systems  Constitutional: Negative for activity change, appetite change and fever.  HENT: Negative for congestion, sinus pressure, sinus pain and sore throat.   Musculoskeletal: Positive for neck pain. Negative for arthralgias and myalgias.  Skin: Negative for rash.  Neurological: Positive for headaches. Negative for dizziness, syncope, facial asymmetry, speech difficulty, weakness and light-headedness.  Hematological: Negative.   Psychiatric/Behavioral: Negative.      Physical Exam Triage Vital Signs ED Triage Vitals  Enc Vitals Group     BP --      Pulse --      Resp --      Temp --      Temp src --      SpO2 --      Weight 02/26/20 1905 230 lb (104.3 kg)     Height 02/26/20 1905 _0  (1.651 m)     Head Circumference --      Peak Flow --      Pain Score 02/26/20  1904 6     Pain Loc --      Pain Edu? --      Excl. in [place]? --    No data found.  Updated Vital Signs BP 118/75 (BP Location: Right Arm)   Pulse 71   Temp 98.5 F (36.9 C) (Oral)   Resp 18   Ht _1  (1.651 m)   Wt 230 lb (104.3 kg)   SpO2 100%   BMI 38.27 kg/m   Visual Acuity Right Eye Distance:   Left Eye Distance:   Bilateral Distance:    Right Eye Near:   Left Eye Near:    Bilateral Near:     Physical Exam Vitals and nursing note reviewed.  Constitutional:      General: She is not in acute distress.    Appearance: She is well-developed. She is not toxic-appearing.  HENT:     Head: Normocephalic and atraumatic.     Mouth/Throat:     Mouth: Mucous membranes are moist.     Pharynx: Oropharynx is clear.  Eyes:     General: No visual field deficit or scleral icterus.    Extraocular Movements: Extraocular movements intact.     Right eye: Normal extraocular motion and no nystagmus.     Left eye: Normal extraocular motion and no nystagmus.     Pupils: Pupils are equal, round, and reactive to light. Pupils are equal.  Cardiovascular:     Rate and Rhythm: Normal rate and regular rhythm.     Heart sounds: Normal heart sounds. No murmur heard.  No gallop.   Pulmonary:     Effort: Pulmonary effort is normal.     Breath sounds: Normal breath sounds. No wheezing, rhonchi or rales.  Musculoskeletal:        General: Tenderness present. No swelling. Normal range of motion.     Cervical back: Normal range of motion and neck supple. No rigidity.     Comments: Patient does have muscle tension in her trapezius and levator scapula bilaterally.  Lymphadenopathy:     Cervical: No cervical adenopathy.  Skin:    General: Skin is warm and dry.     Capillary Refill: Capillary refill takes less than 2 seconds.  Findings: No erythema or rash.  Neurological:     Mental Status: She is alert and oriented to person, place, and time.     GCS: GCS eye subscore is 4. GCS verbal  subscore is 5. GCS motor subscore is 6.     Cranial Nerves: No cranial nerve deficit, dysarthria or facial asymmetry.     Sensory: No sensory deficit.     Motor: No weakness.     Coordination: Coordination normal.     Gait: Gait normal.  Psychiatric:        Mood and Affect: Mood normal.        Speech: Speech normal.        Behavior: Behavior normal.      UC Treatments / Results  Labs (all labs ordered are listed, but only abnormal results are displayed) Labs Reviewed - No data to display  EKG   Radiology No results found.  Procedures Procedures (including critical care time)  Medications Ordered in UC Medications - No data to display  Initial Impression / Assessment and Plan / UC Course  I have reviewed the triage vital signs and the nursing notes.  Pertinent labs & imaging results that were available during my care of the patient were reviewed by me and considered in my medical decision making (see chart for details).   Patient is here for evaluation of a headache that she has had for 1 week.  Patient states that she has not tried taking any medication to see if it helps her headache because she would prefer to try natural methods.  Patient's neurological exam is unremarkable.  Cranial nerves II through XII grossly intact.  Patient has strong equal grips bilaterally.  Suspect patient's headaches are more result of tension and combined symptoms.  DC home with diagnosis of tension headache, encourage patient to use turmeric 1000 g twice daily for inflammation control, take frequent breaks from her computer workstation, install on ultraviolet filter to help cut down on the glare and eyestrain. Final Clinical Impressions(s) / UC Diagnoses   Final diagnoses:  Tension headache     Discharge Instructions     You can use turmeric, 1000 mg twice daily, as needed for pain and inflammation.  Inquire about having an ultraviolet light filter installed on your computer to help  cut down on clear.  Go back to your new prescription and wear them for increasing periods until your eyes adjust.  Take frequent breaks from your workstation so that you limit your eyestrain.  Massage therapy can help with the muscle tension in your neck.  If any new or worsening symptoms develop return for reevaluation or see your primary care provider.    ED Prescriptions    None     PDMP not reviewed this encounter.   Margarette Canada, NP 02/26/20 1925

## 2020-02-27 ENCOUNTER — Ambulatory Visit: Payer: Self-pay

## 2020-03-21 ENCOUNTER — Encounter: Payer: Self-pay | Admitting: Obstetrics and Gynecology

## 2021-02-16 NOTE — Progress Notes (Signed)
Chief Complaint  Patient presents with   Gynecologic Exam    No concerns     HPI:      Ms. Debria Broecker is a 40 y.o. [id number] who LMP was No LMP recorded. (Menstrual status: IUD)., presents today for her annual examination.  Her menses are infrequent with IUD. Has occas vag bleeding and cramping.   Sex activity: single partner, contraception- MIrena placed 08/11/16. No dyspareunia. Has occas spotting after sex  Last Pap: 02/07/19  Results were: no abnormalities /neg HPV DNA  Hx of STDs: none  There is a FH of breast cancer in her MGM, pat aunt and PGM. There is a FH of ovarian cancer in her pat aunt. Pt is MyRisk neg 2019 except AXIN2 VUS; IBIS=18.6%.  The patient does not do self-breast exams.  Tobacco use: The patient denies current or previous tobacco use. Alcohol use: none No drug use Exercise: not active  She does get adequate calcium and Vitamin D in her diet. Normal fasting labs 11/21.  She is being followed for enlarged thyroid with normal thyroid labs 11/21. Had u/s and bx in past. Thyroid u/s 11/21 with 1 lesion RT thryoid slightly larger but already biopsied in past. Per Dr. Kathyrn Sheriff at [facility], pt needs thyroid u/s every 2 yrs and yearly full thyroid pael.   Pt with issues with insomnia this yr. Was having frequent headaches and saw neuro. Headaches improved but insomnia persists. Was given trazodone for sleep but didn't always work and doesn't like to take meds. Falls asleep but wakes after 3 hrs and can't fall back asleep. Having to urinate will wake her sometimes. Drinks caffeine in AM only. Has a lot of stress/anxiety currently. Tried ~6 mg melatonin without relief.   Past Medical History:  Diagnosis Date   Anemia    BRCA negative 10/2017   MyRisk neg except AXIN2 VUS   Enlarged thyroid    Family history of breast cancer 10/2017   IBIS=18.6%   Family history of ovarian cancer    Vitamin D deficiency 2019    Past Surgical History:   Procedure Laterality Date   NO PAST SURGERIES      Family History  Problem Relation Age of Onset   Breast cancer Maternal Grandmother        87s   Diabetes Maternal Grandmother    Breast cancer Paternal Grandmother 44   Diabetes Paternal Grandmother    Colon cancer Paternal Grandfather    Ovarian cancer Paternal Aunt 7   Breast cancer Paternal Aunt        9s   Other Father 66       tumor in pituitary gland and had it removed    Social History   Socioeconomic History   Marital status: Married    Spouse name: Not on file   Number of children: Not on file   Years of education: Not on file   Highest education level: Not on file  Occupational History   Not on file  Tobacco Use   Smoking status: Never   Smokeless tobacco: Never  Vaping Use   Vaping Use: Never used  Substance and Sexual Activity   Alcohol use: Yes    Alcohol/week: 1.0 - 2.0 standard drink    Types: 1 - 2 Glasses of wine per week    Comment: occasional   Drug use: No   Sexual activity: Yes    Birth control/protection: I.U.D.    Comment: Mirena  Other Topics Concern  Not on file  Social History Narrative   Not on file   Social Determinants of Health   Financial Resource Strain: Not on file  Food Insecurity: Not on file  Transportation Needs: Not on file  Physical Activity: Not on file  Stress: Not on file  Social Connections: Not on file  Intimate Partner Violence: Not on file     Current Outpatient Medications:    albuterol (VENTOLIN HFA) 108 (90 Base) MCG/ACT inhaler, INHALE 2 INHALATIONS INTO THE LUNGS EVERY 6 (SIX) HOURS AS NEEDED FOR WHEEZING., Disp: , Rfl:    Riboflavin 25 MG TABS, Take by mouth., Disp: , Rfl:    budesonide (PULMICORT) 0.5 MG/2ML nebulizer solution, Inhale into the lungs., Disp: , Rfl:    levonorgestrel (MIRENA, 52 MG,) 20 MCG/24HR IUD, 1 Intra Uterine Device (1 each total) by Intrauterine route once., Disp: 1 Intra Uterine Device, Rfl: 0   traZODone (DESYREL) 50 MG  tablet, Take 25-100 mg by mouth at bedtime. (Patient not taking: Reported on 02/17/2021), Disp: , Rfl:   ROS:  Review of Systems  Constitutional:  Negative for fatigue, fever and unexpected weight change.  Respiratory:  Negative for cough, shortness of breath and wheezing.   Cardiovascular:  Negative for chest pain, palpitations and leg swelling.  Gastrointestinal:  Negative for blood in stool, constipation, diarrhea, nausea and vomiting.  Endocrine: Negative for cold intolerance, heat intolerance and polyuria.  Genitourinary:  Negative for dyspareunia, dysuria, flank pain, frequency, genital sores, hematuria, menstrual problem, pelvic pain, urgency, vaginal bleeding, vaginal discharge and vaginal pain.  Musculoskeletal:  Negative for back pain, joint swelling and myalgias.  Skin:  Negative for rash.  Neurological:  Negative for dizziness, syncope, light-headedness, numbness and headaches.  Hematological:  Negative for adenopathy.  Psychiatric/Behavioral:  Positive for sleep disturbance. Negative for agitation, confusion and suicidal ideas. The patient is not nervous/anxious.    Objective: BP 116/80   Ht 5' 6"  (1.676 m)   Wt 236 lb (107 kg)   BMI 38.09 kg/m    Physical Exam Constitutional:      Appearance: She is well-developed.  Genitourinary:     Vulva normal.     Right Labia: No rash, tenderness or lesions.    Left Labia: No tenderness, lesions or rash.    No vaginal discharge, erythema or tenderness.      Right Adnexa: not tender and no mass present.    Left Adnexa: not tender and no mass present.    No cervical motion tenderness, friability or polyp.     IUD strings visualized.     Uterus is not enlarged or tender.  Breasts:    Right: No mass, nipple discharge, skin change or tenderness.     Left: No mass, nipple discharge, skin change or tenderness.  Neck:     Thyroid: Thyromegaly present.  Cardiovascular:     Rate and Rhythm: Normal rate and regular rhythm.      Heart sounds: Normal heart sounds. No murmur heard. Pulmonary:     Effort: Pulmonary effort is normal.     Breath sounds: Normal breath sounds.  Abdominal:     Palpations: Abdomen is soft.     Tenderness: There is no abdominal tenderness. There is no guarding or rebound.  Musculoskeletal:        General: Normal range of motion.     Cervical back: Normal range of motion.  Lymphadenopathy:     Cervical: No cervical adenopathy.  Neurological:     General: No focal  deficit present.     Mental Status: She is alert and oriented to person, place, and time.     Cranial Nerves: No cranial nerve deficit.  Skin:    General: Skin is warm and dry.  Psychiatric:        Mood and Affect: Mood normal.        Behavior: Behavior normal.        Thought Content: Thought content normal.        Judgment: Judgment normal.  Vitals reviewed.    Assessment/Plan: Encounter for annual routine gynecological examination  Encounter for routine checking of intrauterine contraceptive device (IUD)--IUD strings in cx os; has 8 yr indication now, good till 5/26  Encounter for screening mammogram for malignant neoplasm of breast - Plan: MM 3D SCREEN BREAST BILATERAL; pt to sched mammo  Family history of breast cancer - Plan: MM 3D SCREEN BREAST BILATERAL; MyRisk neg except VUS  Enlarged thyroid - Plan: Thyroid Panel With TSH; labs today. Will do thyroid u/s next yr.  Thyroid disorder screening - Plan: Thyroid Panel With TSH  Psychophysiological insomnia--increase exercise for stress/anxiety, sleep hygiene techniques, suggested therapist/counselor for stress/anxiety. Can do trazodone prn.      F/U  Return in about 1 year (around 02/17/2022).  Marshon Bangs B. Sedric Guia, PA-C 02/17/2021 12:09 PM

## 2021-02-17 ENCOUNTER — Other Ambulatory Visit: Payer: Self-pay

## 2021-02-17 ENCOUNTER — Ambulatory Visit (INDEPENDENT_AMBULATORY_CARE_PROVIDER_SITE_OTHER): Payer: BC Managed Care – PPO | Admitting: Obstetrics and Gynecology

## 2021-02-17 ENCOUNTER — Encounter: Payer: Self-pay | Admitting: Obstetrics and Gynecology

## 2021-02-17 VITALS — BP 116/80 | Ht 66.0 in | Wt 236.0 lb

## 2021-02-17 DIAGNOSIS — Z1329 Encounter for screening for other suspected endocrine disorder: Secondary | ICD-10-CM

## 2021-02-17 DIAGNOSIS — Z1231 Encounter for screening mammogram for malignant neoplasm of breast: Secondary | ICD-10-CM | POA: Diagnosis not present

## 2021-02-17 DIAGNOSIS — F5104 Psychophysiologic insomnia: Secondary | ICD-10-CM

## 2021-02-17 DIAGNOSIS — E049 Nontoxic goiter, unspecified: Secondary | ICD-10-CM

## 2021-02-17 DIAGNOSIS — Z803 Family history of malignant neoplasm of breast: Secondary | ICD-10-CM

## 2021-02-17 DIAGNOSIS — Z01419 Encounter for gynecological examination (general) (routine) without abnormal findings: Secondary | ICD-10-CM | POA: Diagnosis not present

## 2021-02-17 DIAGNOSIS — Z30431 Encounter for routine checking of intrauterine contraceptive device: Secondary | ICD-10-CM

## 2021-02-17 NOTE — Patient Instructions (Signed)
I value your feedback and you entrusting us with your care. If you get a [facility] patient survey, I would appreciate you taking the time to let us know about your experience today. Thank you!  [facility]: [phone]  [facility]: [phone]    

## 2021-02-18 LAB — THYROID PANEL WITH TSH
Free Thyroxine Index: 1.9 (ref 1.2–4.9)
T3 Uptake Ratio: 27 % (ref 24–39)
T4, Total: 6.9 ug/dL (ref 4.5–12.0)
TSH: 0.779 u[IU]/mL (ref 0.450–4.500)

## 2021-02-19 ENCOUNTER — Encounter: Payer: Self-pay | Admitting: Obstetrics and Gynecology

## 2021-02-19 DIAGNOSIS — Z131 Encounter for screening for diabetes mellitus: Secondary | ICD-10-CM

## 2021-02-19 DIAGNOSIS — Z13220 Encounter for screening for lipoid disorders: Secondary | ICD-10-CM

## 2021-02-19 DIAGNOSIS — Z6838 Body mass index (BMI) 38.0-38.9, adult: Secondary | ICD-10-CM

## 2021-02-19 DIAGNOSIS — Z0000 Encounter for general adult medical examination without abnormal findings: Secondary | ICD-10-CM

## 2021-02-24 ENCOUNTER — Other Ambulatory Visit: Payer: BC Managed Care – PPO

## 2021-02-26 ENCOUNTER — Other Ambulatory Visit: Payer: Self-pay

## 2021-02-26 ENCOUNTER — Ambulatory Visit
Admission: RE | Admit: 2021-02-26 | Discharge: 2021-02-26 | Disposition: A | Discharge status: Home / Self Care | Payer: BC Managed Care – PPO | Source: Ambulatory Visit | Attending: Obstetrics and Gynecology | Admitting: Obstetrics and Gynecology

## 2021-02-26 DIAGNOSIS — Z803 Family history of malignant neoplasm of breast: Secondary | ICD-10-CM | POA: Insufficient documentation

## 2021-02-26 DIAGNOSIS — Z1231 Encounter for screening mammogram for malignant neoplasm of breast: Secondary | ICD-10-CM | POA: Insufficient documentation

## 2021-02-27 ENCOUNTER — Other Ambulatory Visit: Payer: Self-pay | Admitting: Obstetrics and Gynecology

## 2021-02-27 DIAGNOSIS — R928 Other abnormal and inconclusive findings on diagnostic imaging of breast: Secondary | ICD-10-CM

## 2021-02-27 DIAGNOSIS — N6320 Unspecified lump in the left breast, unspecified quadrant: Secondary | ICD-10-CM

## 2021-02-27 LAB — COMPREHENSIVE METABOLIC PANEL WITH GFR
ALT: 23 [IU]/L (ref 0–32)
AST: 19 [IU]/L (ref 0–40)
Albumin/Globulin Ratio: 1.8 (ref 1.2–2.2)
Albumin: 4.8 g/dL (ref 3.8–4.8)
Alkaline Phosphatase: 85 [IU]/L (ref 44–121)
BUN/Creatinine Ratio: 18 (ref 9–23)
BUN: 17 mg/dL (ref 6–24)
Bilirubin Total: 0.5 mg/dL (ref 0.0–1.2)
CO2: 24 mmol/L (ref 20–29)
Calcium: 9.2 mg/dL (ref 8.7–10.2)
Chloride: 105 mmol/L (ref 96–106)
Creatinine, Ser: 0.96 mg/dL (ref 0.57–1.00)
Globulin, Total: 2.7 g/dL (ref 1.5–4.5)
Glucose: 99 mg/dL (ref 70–99)
Potassium: 4.9 mmol/L (ref 3.5–5.2)
Sodium: 141 mmol/L (ref 134–144)
Total Protein: 7.5 g/dL (ref 6.0–8.5)
eGFR: 77 mL/min/{1.73_m2}

## 2021-02-27 LAB — HEMOGLOBIN A1C
Est. average glucose Bld gHb Est-mCnc: 111 mg/dL
Hgb A1c MFr Bld: 5.5 % (ref 4.8–5.6)

## 2021-02-27 LAB — LIPID PANEL
Chol/HDL Ratio: 3.3 ratio (ref 0.0–4.4)
Cholesterol, Total: 179 mg/dL (ref 100–199)
HDL: 55 mg/dL (ref >39)
LDL Chol Calc (NIH): 114 mg/dL — ABNORMAL HIGH (ref 0–99)
Triglycerides: 50 mg/dL (ref 0–149)
VLDL Cholesterol Cal: 10 mg/dL (ref 5–40)

## 2021-03-13 ENCOUNTER — Ambulatory Visit
Admission: RE | Admit: 2021-03-13 | Discharge: 2021-03-13 | Disposition: A | Discharge status: Home / Self Care | Payer: BC Managed Care – PPO | Source: Ambulatory Visit | Attending: Obstetrics and Gynecology | Admitting: Obstetrics and Gynecology

## 2021-03-13 ENCOUNTER — Other Ambulatory Visit: Payer: Self-pay

## 2021-03-13 ENCOUNTER — Encounter: Payer: Self-pay | Admitting: Obstetrics and Gynecology

## 2021-03-13 DIAGNOSIS — N6320 Unspecified lump in the left breast, unspecified quadrant: Secondary | ICD-10-CM

## 2021-03-13 DIAGNOSIS — R928 Other abnormal and inconclusive findings on diagnostic imaging of breast: Secondary | ICD-10-CM

## 2021-05-05 ENCOUNTER — Ambulatory Visit: Payer: BC Managed Care – PPO

## 2022-01-28 ENCOUNTER — Other Ambulatory Visit: Payer: Self-pay | Admitting: Obstetrics and Gynecology

## 2022-01-28 DIAGNOSIS — Z1231 Encounter for screening mammogram for malignant neoplasm of breast: Secondary | ICD-10-CM

## 2022-03-09 ENCOUNTER — Ambulatory Visit
Admission: RE | Admit: 2022-03-09 | Discharge: 2022-03-09 | Disposition: A | Discharge status: Home / Self Care | Payer: BC Managed Care – PPO | Source: Ambulatory Visit | Attending: Obstetrics and Gynecology | Admitting: Obstetrics and Gynecology

## 2022-03-09 DIAGNOSIS — Z1231 Encounter for screening mammogram for malignant neoplasm of breast: Secondary | ICD-10-CM | POA: Insufficient documentation

## 2022-04-01 ENCOUNTER — Other Ambulatory Visit: Payer: Self-pay | Admitting: Family Medicine

## 2022-04-01 DIAGNOSIS — E049 Nontoxic goiter, unspecified: Secondary | ICD-10-CM

## 2022-04-10 ENCOUNTER — Ambulatory Visit
Admission: RE | Admit: 2022-04-10 | Discharge: 2022-04-10 | Disposition: A | Discharge status: Home / Self Care | Payer: BC Managed Care – PPO | Source: Ambulatory Visit | Attending: Family Medicine | Admitting: Family Medicine

## 2022-04-10 DIAGNOSIS — E049 Nontoxic goiter, unspecified: Secondary | ICD-10-CM | POA: Diagnosis present

## 2022-09-01 IMAGING — MG MM DIGITAL DIAGNOSTIC UNILAT*L* W/ TOMO W/ CAD
6 series · 6 of 18 positions shown · non-contrast
Comparison: Previous baseline exam.

CLINICAL DATA: Callback for possible LEFT breast mass from baseline
exam

EXAM:
DIGITAL DIAGNOSTIC UNILATERAL LEFT MAMMOGRAM WITH TOMOSYNTHESIS AND
CAD; ULTRASOUND LEFT BREAST LIMITED
TECHNIQUE: Left digital diagnostic mammography and breast tomosynthesis was
performed. The images were evaluated with computer-aided detection.;
Targeted ultrasound examination of the left breast was performed.

[L CC synth-2D (1 of 2)]
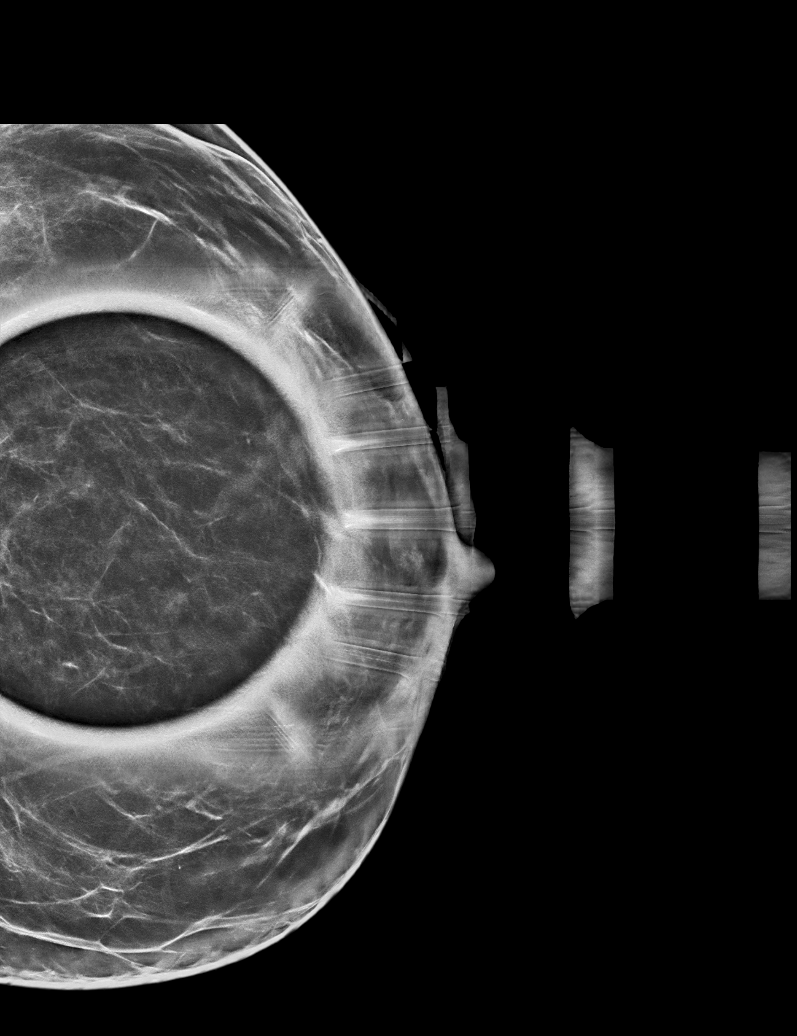

[L CC synth-2D (2 of 2)]
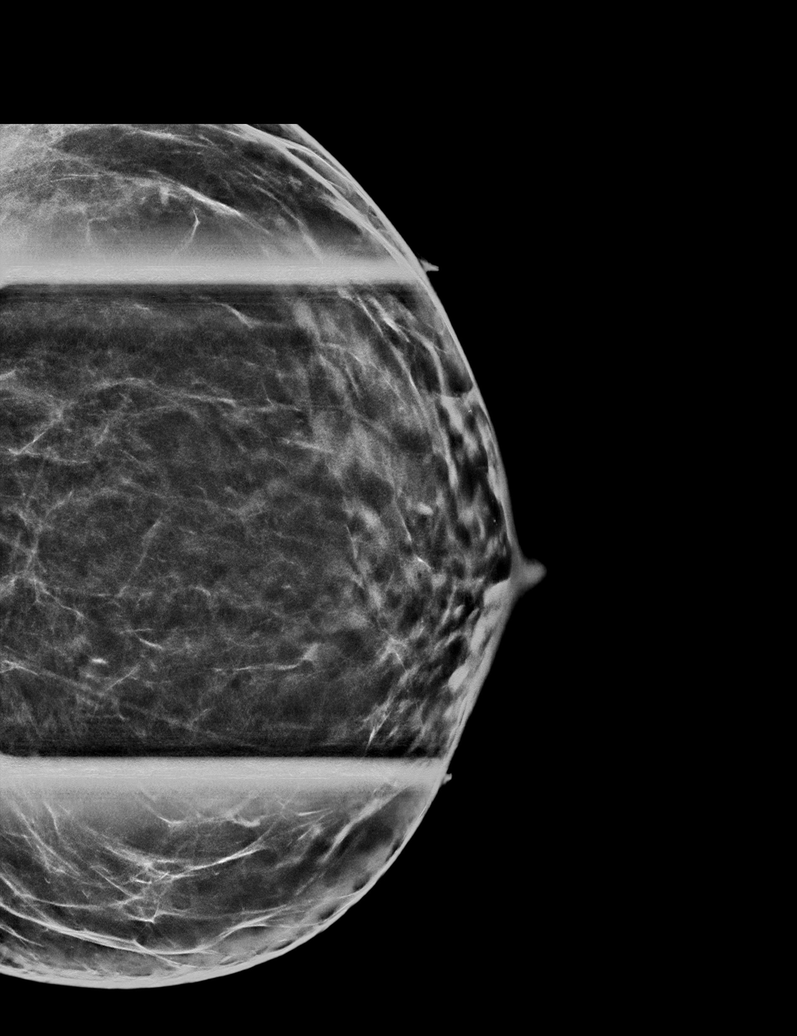

[L MLO synth-2D]
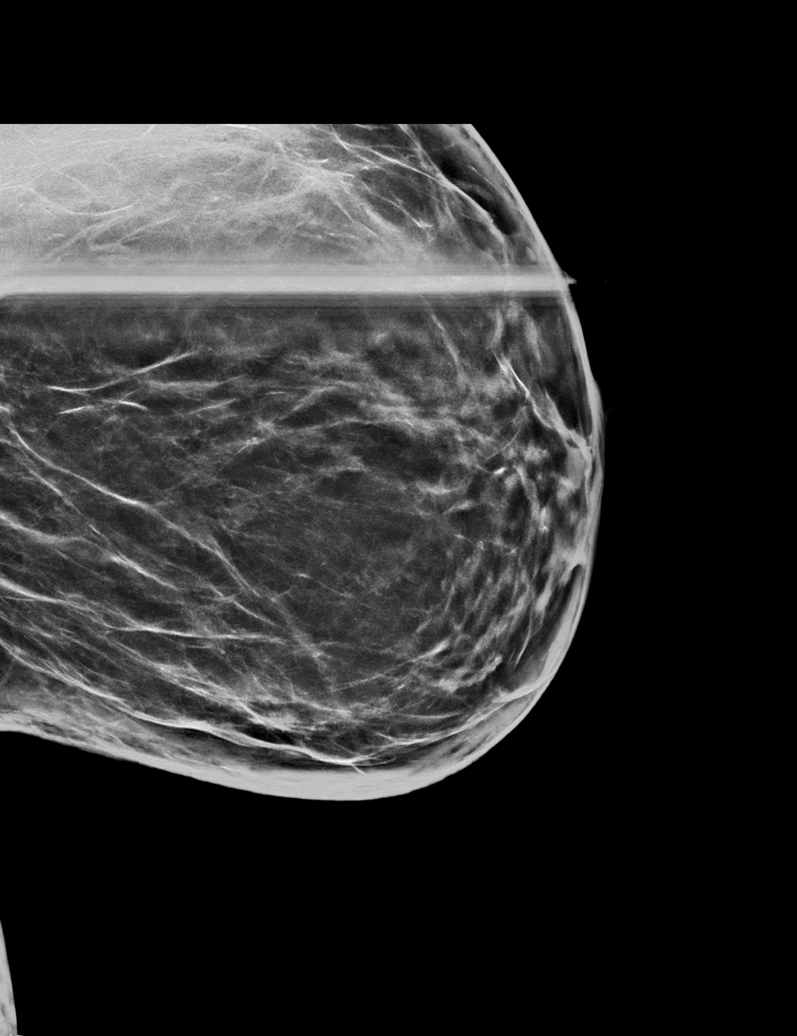

[L CC tomo (1 of 2) · tomo slice 33/66.0]
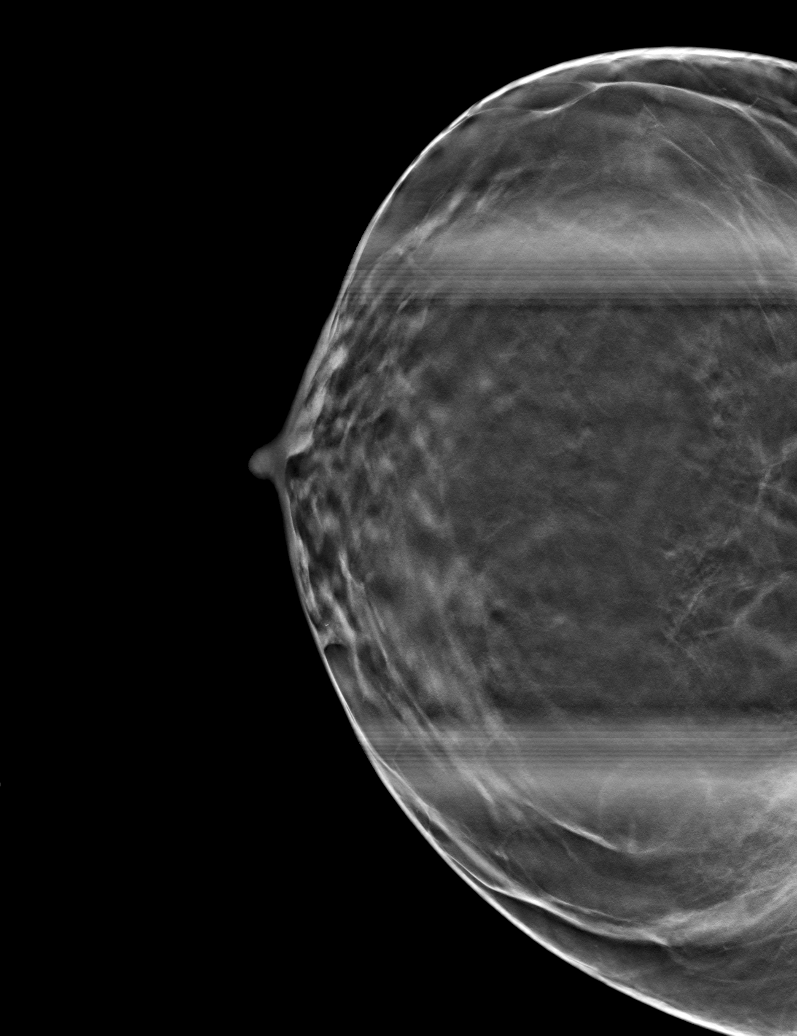

[L MLO tomo · tomo slice 35/70.0]
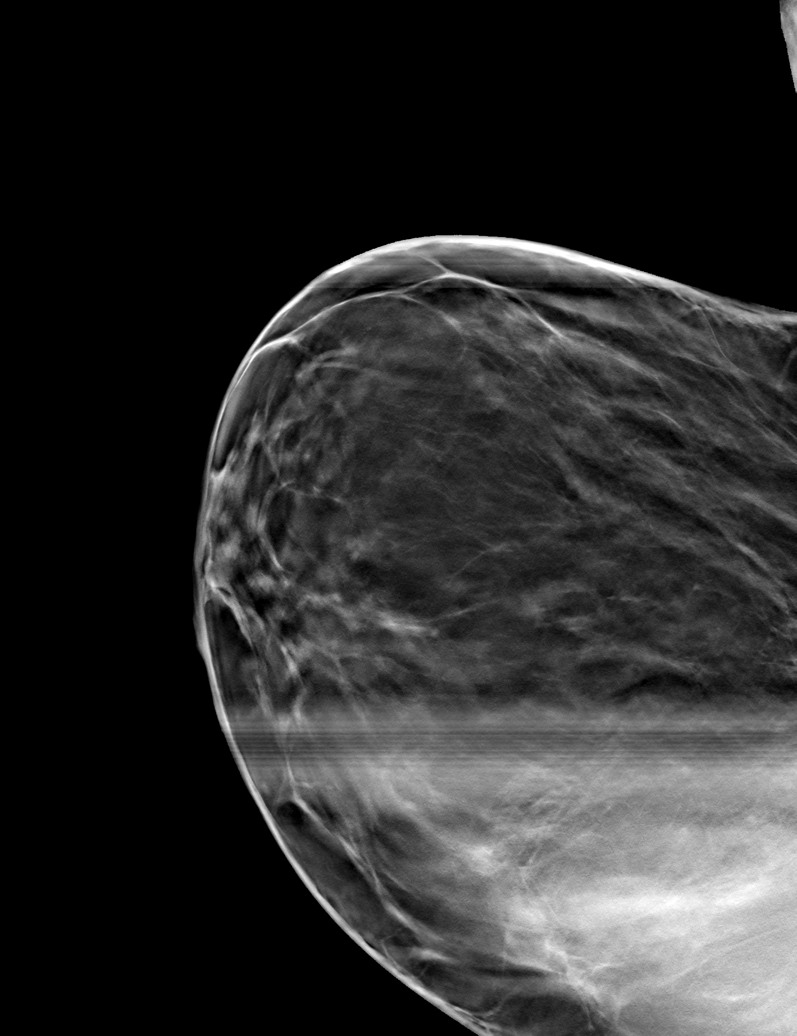

[L CC tomo (2 of 2) · tomo slice 35/70.0]
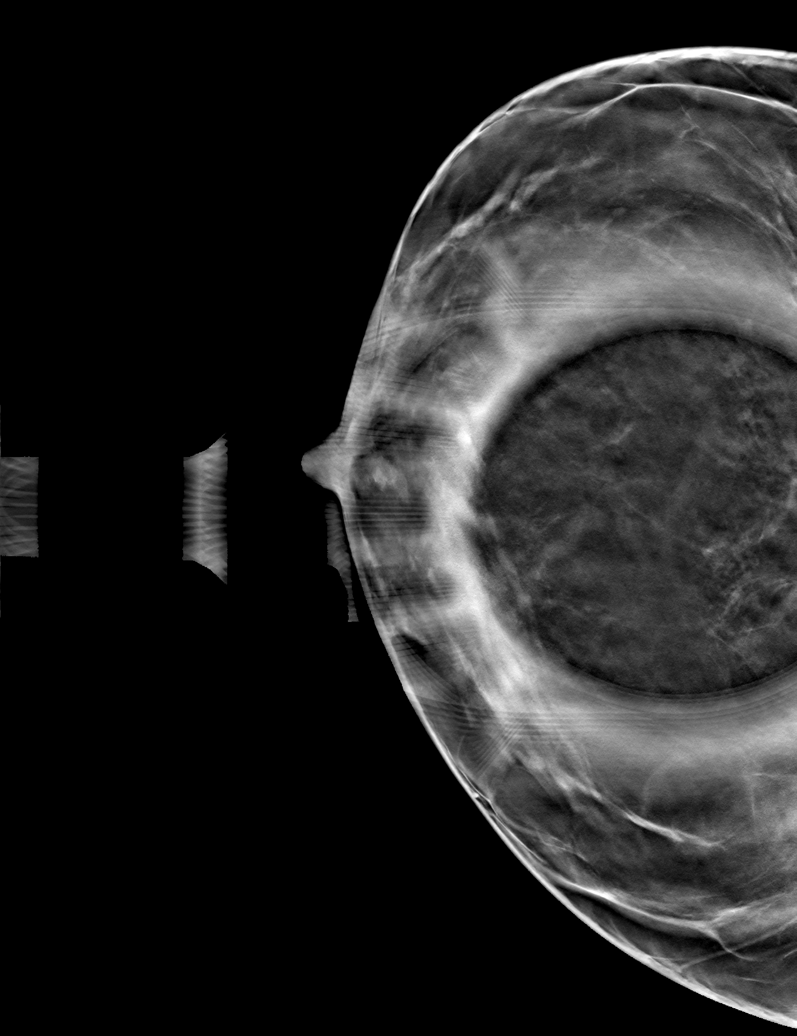

[6 of 18 positions shown; findings below may reference images not displayed]

ACR Breast Density Category c: The breast tissue is heterogeneously
dense, which may obscure small masses.
FINDINGS: Spot compression tomosynthesis views demonstrate near complete
effacement of previously described possible mass in the LEFT lower
breast at middle depth. There is a 4-5 mm oval circumscribed mass
noted on spot MLO imaging in the lower breast at middle depth on MLO
slice 49.

On physical exam, no suspicious mass is appreciated.

Targeted ultrasound was performed LEFT lower breast. At 6 o'clock 6
cm from the nipple, there is an oval circumscribed anechoic mass
with posterior acoustic enhancement. It measures 4 x 3 by 2 mm and
is consistent with a benign simple cyst. This corresponds to the
oval circumscribed mass noted mammographically.
IMPRESSION: There is a benign cyst in the LEFT lower breast. No mammographic or
sonographic evidence of malignancy at the site of screening
mammographic concern.

RECOMMENDATION:
Screening mammogram in one year.(Code:69-6-ZS5)

I have discussed the findings and recommendations with the patient.
If applicable, a reminder letter will be sent to the patient
regarding the next appointment.

BI-RADS CATEGORY  2: Benign.

## 2022-09-01 IMAGING — US US BREAST*L* LIMITED INC AXILLA
2 series · 9 of 9 positions shown · non-contrast
Comparison: Previous baseline exam.

CLINICAL DATA: Callback for possible LEFT breast mass from baseline
exam

EXAM:
DIGITAL DIAGNOSTIC UNILATERAL LEFT MAMMOGRAM WITH TOMOSYNTHESIS AND
CAD; ULTRASOUND LEFT BREAST LIMITED
TECHNIQUE: Left digital diagnostic mammography and breast tomosynthesis was
performed. The images were evaluated with computer-aided detection.;
Targeted ultrasound examination of the left breast was performed.

[Series 1: us breast*left* limited inc axilla · 0.09mm/px · 2 of 2 slices shown (1 of 2)]
[im 1/2]
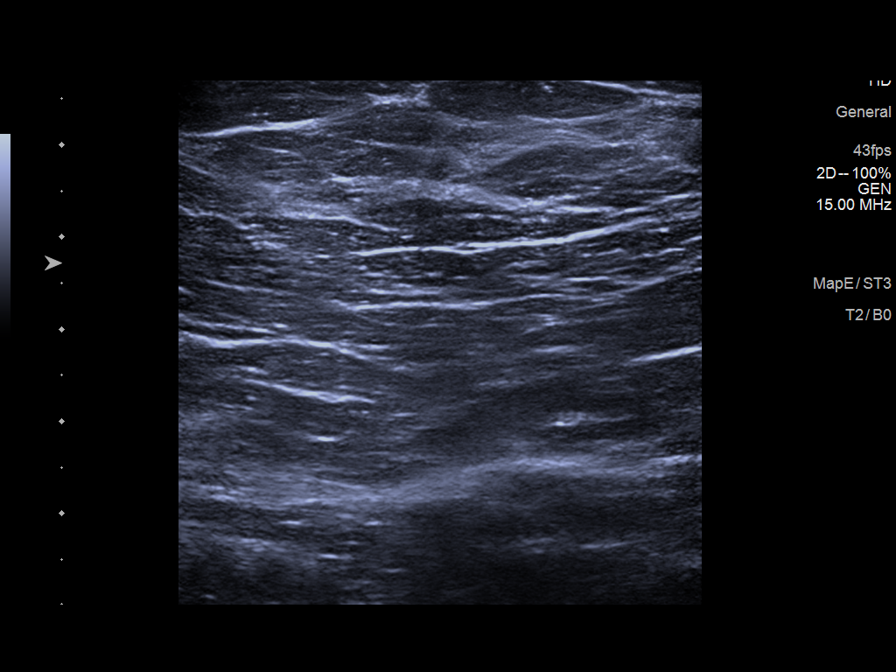
[im 2/2]
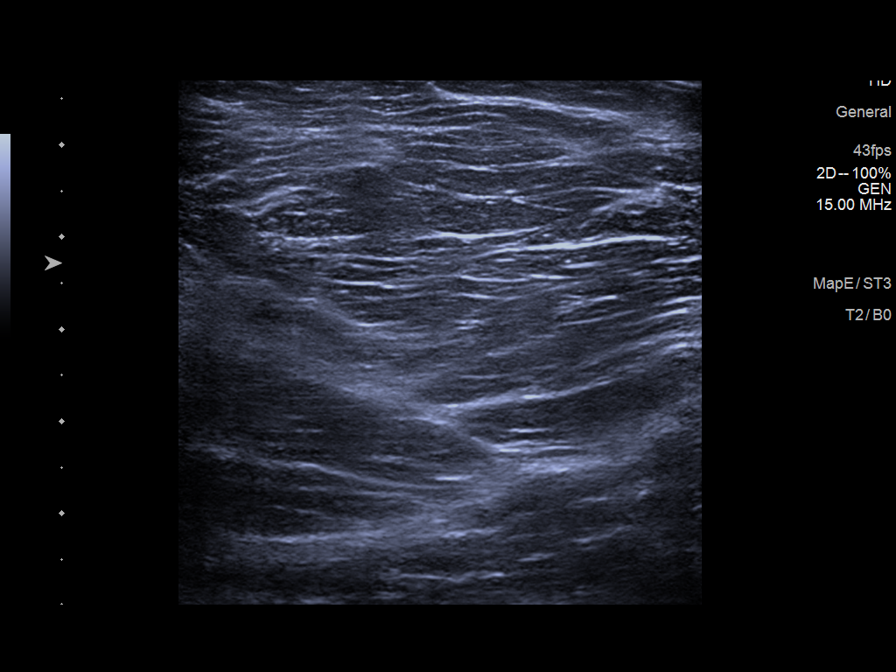

[Series 2: us breast*left* limited inc axilla · 0.04mm/px · 7 of 7 slices shown (2 of 2)]
[im 1/7]
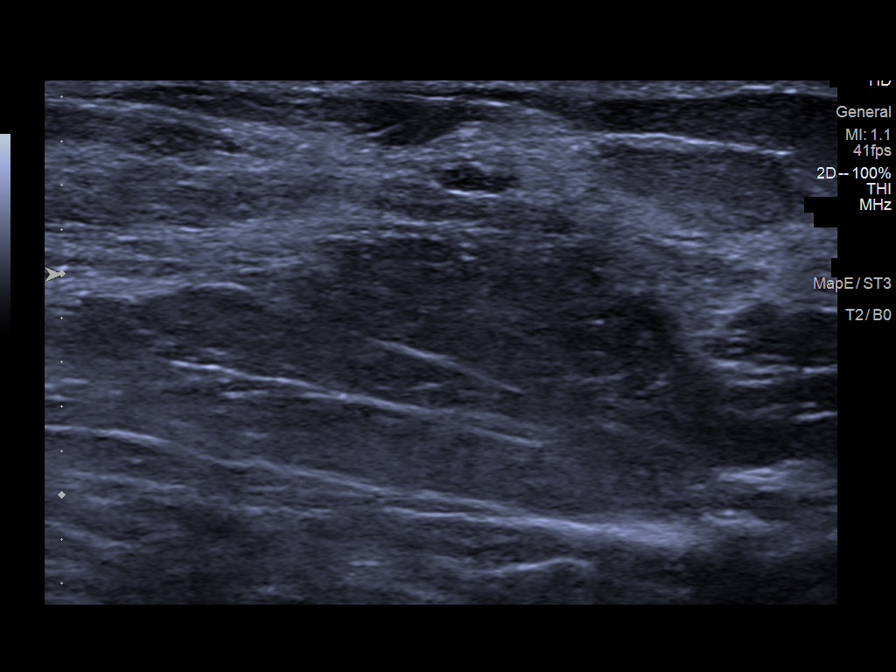
[im 2/7]
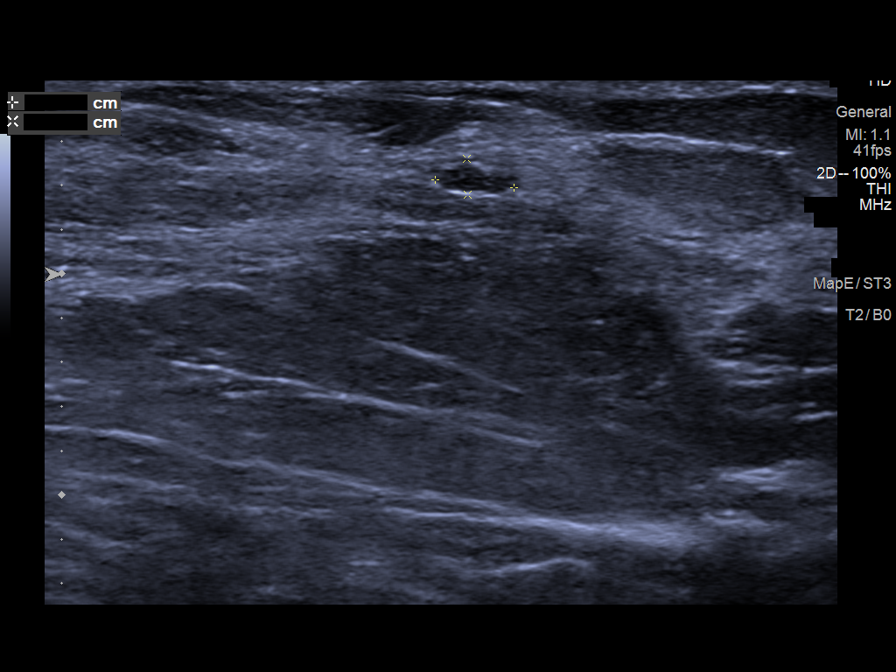
[im 3/7]
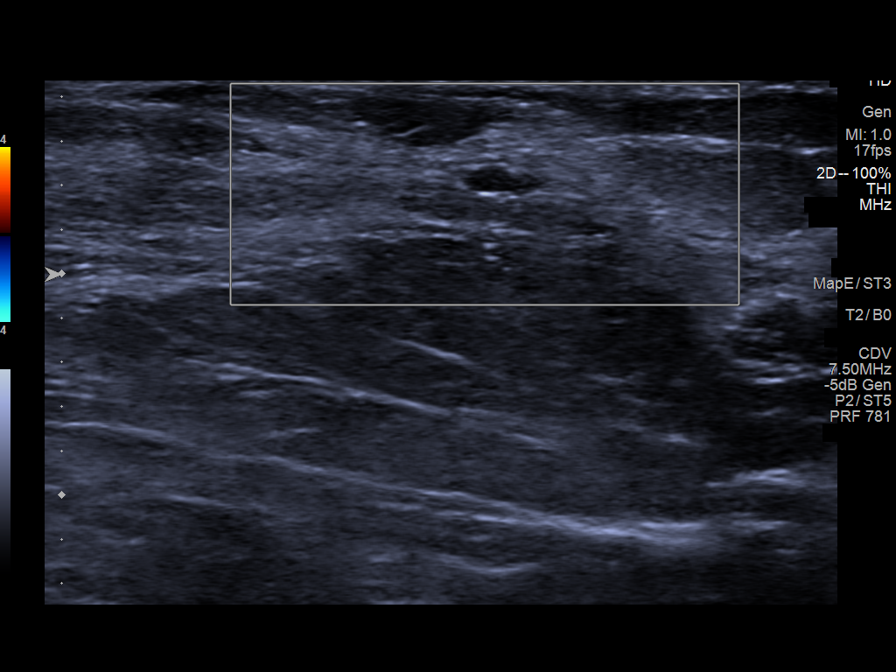
[im 4/7]
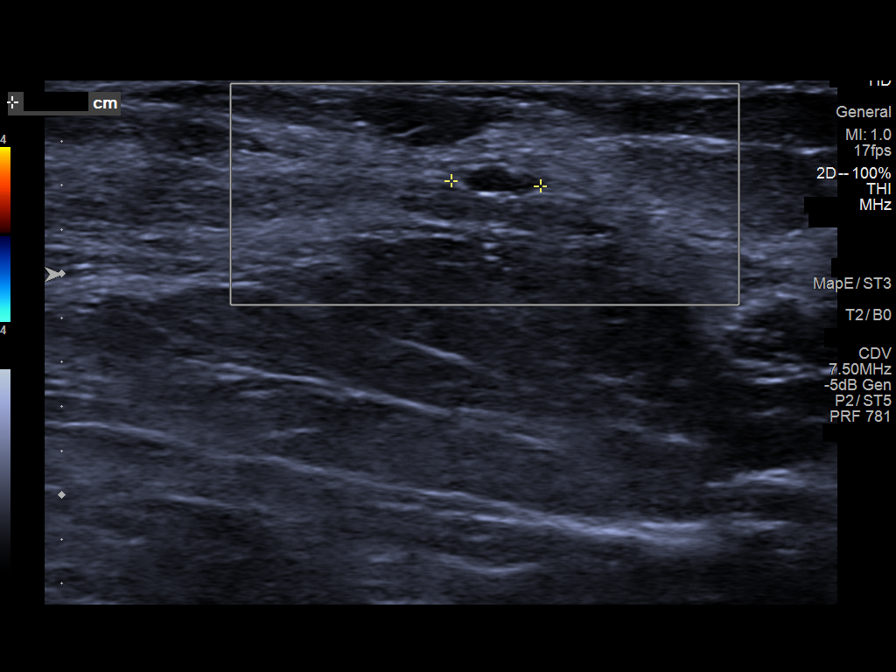
[im 5/7]
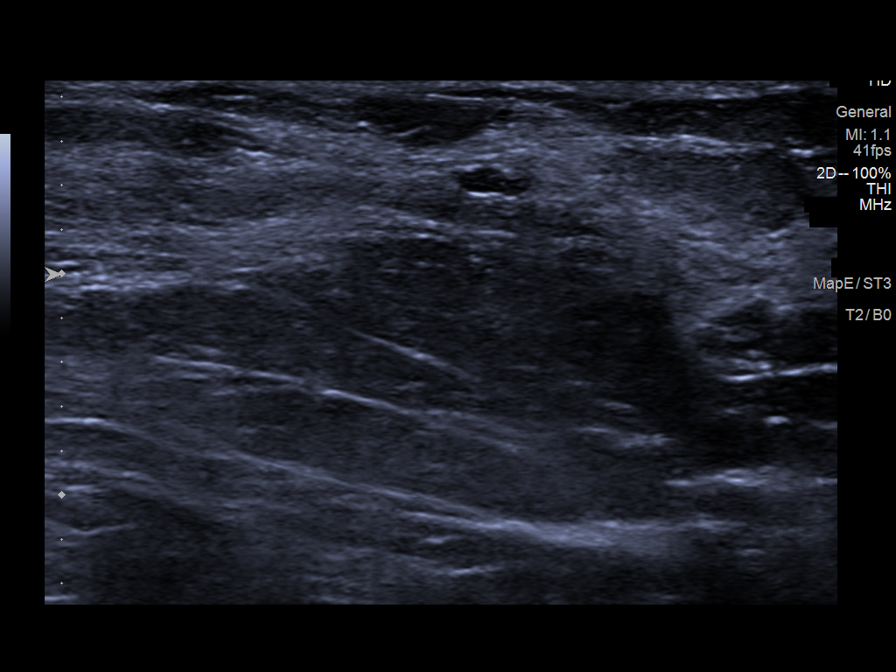
[im 6/7]
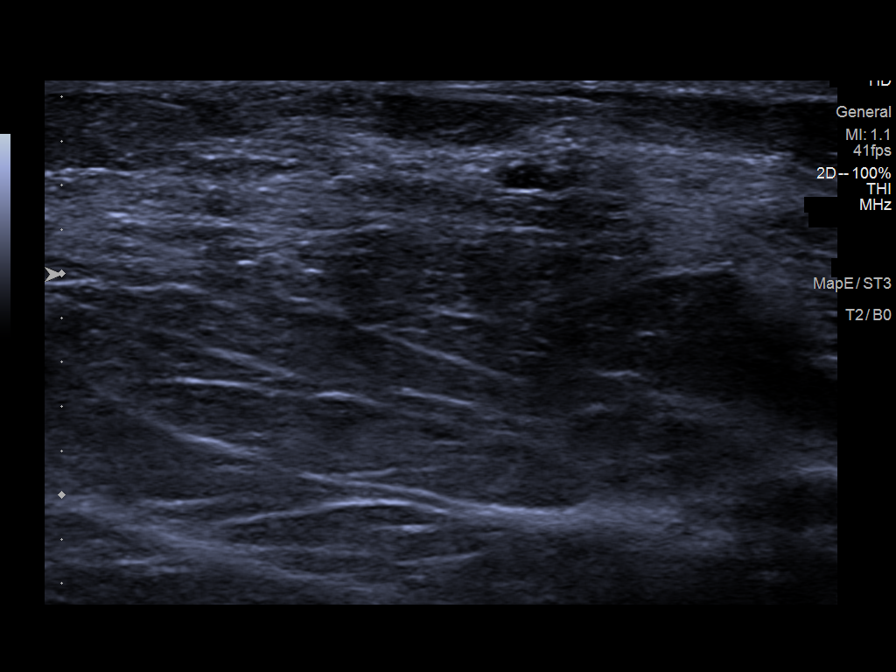
[im 7/7]
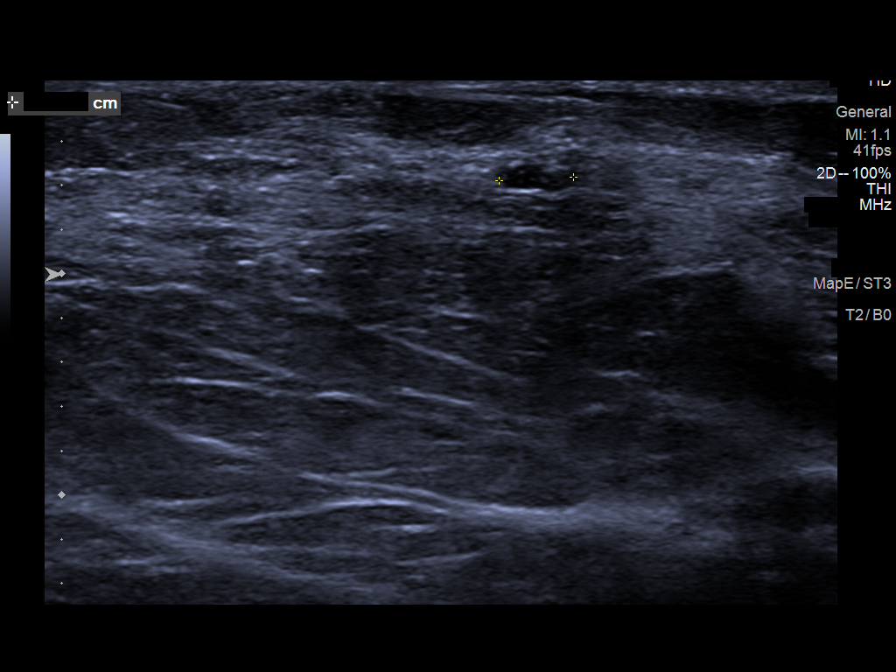

[9 of 9 positions shown; findings below may reference images not displayed]

ACR Breast Density Category c: The breast tissue is heterogeneously
dense, which may obscure small masses.
FINDINGS: Spot compression tomosynthesis views demonstrate near complete
effacement of previously described possible mass in the LEFT lower
breast at middle depth. There is a 4-5 mm oval circumscribed mass
noted on spot MLO imaging in the lower breast at middle depth on MLO
slice 49.

On physical exam, no suspicious mass is appreciated.

Targeted ultrasound was performed LEFT lower breast. At 6 o'clock 6
cm from the nipple, there is an oval circumscribed anechoic mass
with posterior acoustic enhancement. It measures 4 x 3 by 2 mm and
is consistent with a benign simple cyst. This corresponds to the
oval circumscribed mass noted mammographically.
IMPRESSION: There is a benign cyst in the LEFT lower breast. No mammographic or
sonographic evidence of malignancy at the site of screening
mammographic concern.

RECOMMENDATION:
Screening mammogram in one year.(Code:69-6-ZS5)

I have discussed the findings and recommendations with the patient.
If applicable, a reminder letter will be sent to the patient
regarding the next appointment.

BI-RADS CATEGORY  2: Benign.

## 2023-01-25 ENCOUNTER — Other Ambulatory Visit: Payer: Self-pay | Admitting: Obstetrics and Gynecology

## 2023-01-25 DIAGNOSIS — Z1231 Encounter for screening mammogram for malignant neoplasm of breast: Secondary | ICD-10-CM

## 2023-02-10 ENCOUNTER — Ambulatory Visit
Admission: RE | Admit: 2023-02-10 | Discharge: 2023-02-10 | Disposition: A | Discharge status: Home / Self Care | Payer: BC Managed Care – PPO | Source: Ambulatory Visit | Attending: Obstetrics and Gynecology | Admitting: Obstetrics and Gynecology

## 2023-02-10 DIAGNOSIS — Z1231 Encounter for screening mammogram for malignant neoplasm of breast: Secondary | ICD-10-CM | POA: Diagnosis present

## 2023-06-30 ENCOUNTER — Other Ambulatory Visit: Payer: Self-pay | Admitting: Obstetrics and Gynecology

## 2023-06-30 DIAGNOSIS — Z1231 Encounter for screening mammogram for malignant neoplasm of breast: Secondary | ICD-10-CM

## 2023-07-12 ENCOUNTER — Other Ambulatory Visit: Payer: Self-pay | Admitting: Family Medicine

## 2023-07-12 DIAGNOSIS — E042 Nontoxic multinodular goiter: Secondary | ICD-10-CM

## 2023-07-12 DIAGNOSIS — E049 Nontoxic goiter, unspecified: Secondary | ICD-10-CM

## 2023-07-13 ENCOUNTER — Ambulatory Visit
Admission: RE | Admit: 2023-07-13 | Discharge: 2023-07-13 | Disposition: A | Discharge status: Home / Self Care | Source: Ambulatory Visit | Attending: Family Medicine | Admitting: Family Medicine

## 2023-07-13 DIAGNOSIS — E042 Nontoxic multinodular goiter: Secondary | ICD-10-CM | POA: Diagnosis present

## 2023-07-13 DIAGNOSIS — E049 Nontoxic goiter, unspecified: Secondary | ICD-10-CM | POA: Insufficient documentation

## 2023-07-27 ENCOUNTER — Ambulatory Visit: Admitting: Obstetrics and Gynecology
